# Patient Record
Sex: Male | Born: 1965 | Race: Black or African American | Hispanic: No | Smoking: Former smoker
Health system: Southern US, Community
[De-identification: ages and names within clinical notes are randomized; demographics above are authoritative.]

---

## 1990-04-01 DIAGNOSIS — Z9889 Other specified postprocedural states: Secondary | ICD-10-CM

## 1990-04-01 HISTORY — DX: Other specified postprocedural states: Z98.890

## 1999-01-09 ENCOUNTER — Encounter: Payer: Self-pay | Admitting: Emergency Medicine

## 1999-01-09 ENCOUNTER — Emergency Department (HOSPITAL_COMMUNITY): Admission: EM | Admit: 1999-01-09 | Discharge: 1999-01-09 | Payer: Self-pay | Admitting: Emergency Medicine

## 2005-10-28 ENCOUNTER — Emergency Department (HOSPITAL_COMMUNITY): Admission: EM | Admit: 2005-10-28 | Discharge: 2005-10-28 | Payer: Self-pay | Admitting: *Deleted

## 2005-12-16 ENCOUNTER — Emergency Department (HOSPITAL_COMMUNITY): Admission: EM | Admit: 2005-12-16 | Discharge: 2005-12-16 | Payer: Self-pay | Admitting: Emergency Medicine

## 2008-06-11 ENCOUNTER — Emergency Department (HOSPITAL_COMMUNITY): Admission: EM | Admit: 2008-06-11 | Discharge: 2008-06-12 | Payer: Self-pay | Admitting: Emergency Medicine

## 2009-11-16 IMAGING — CR DG CHEST 2V
2 series · 2 of 2 positions shown · non-contrast
Comparison: 12/16/2005.

CLINICAL DATA: Chest pain.

CHEST - 2 VIEW

[w chest pa]
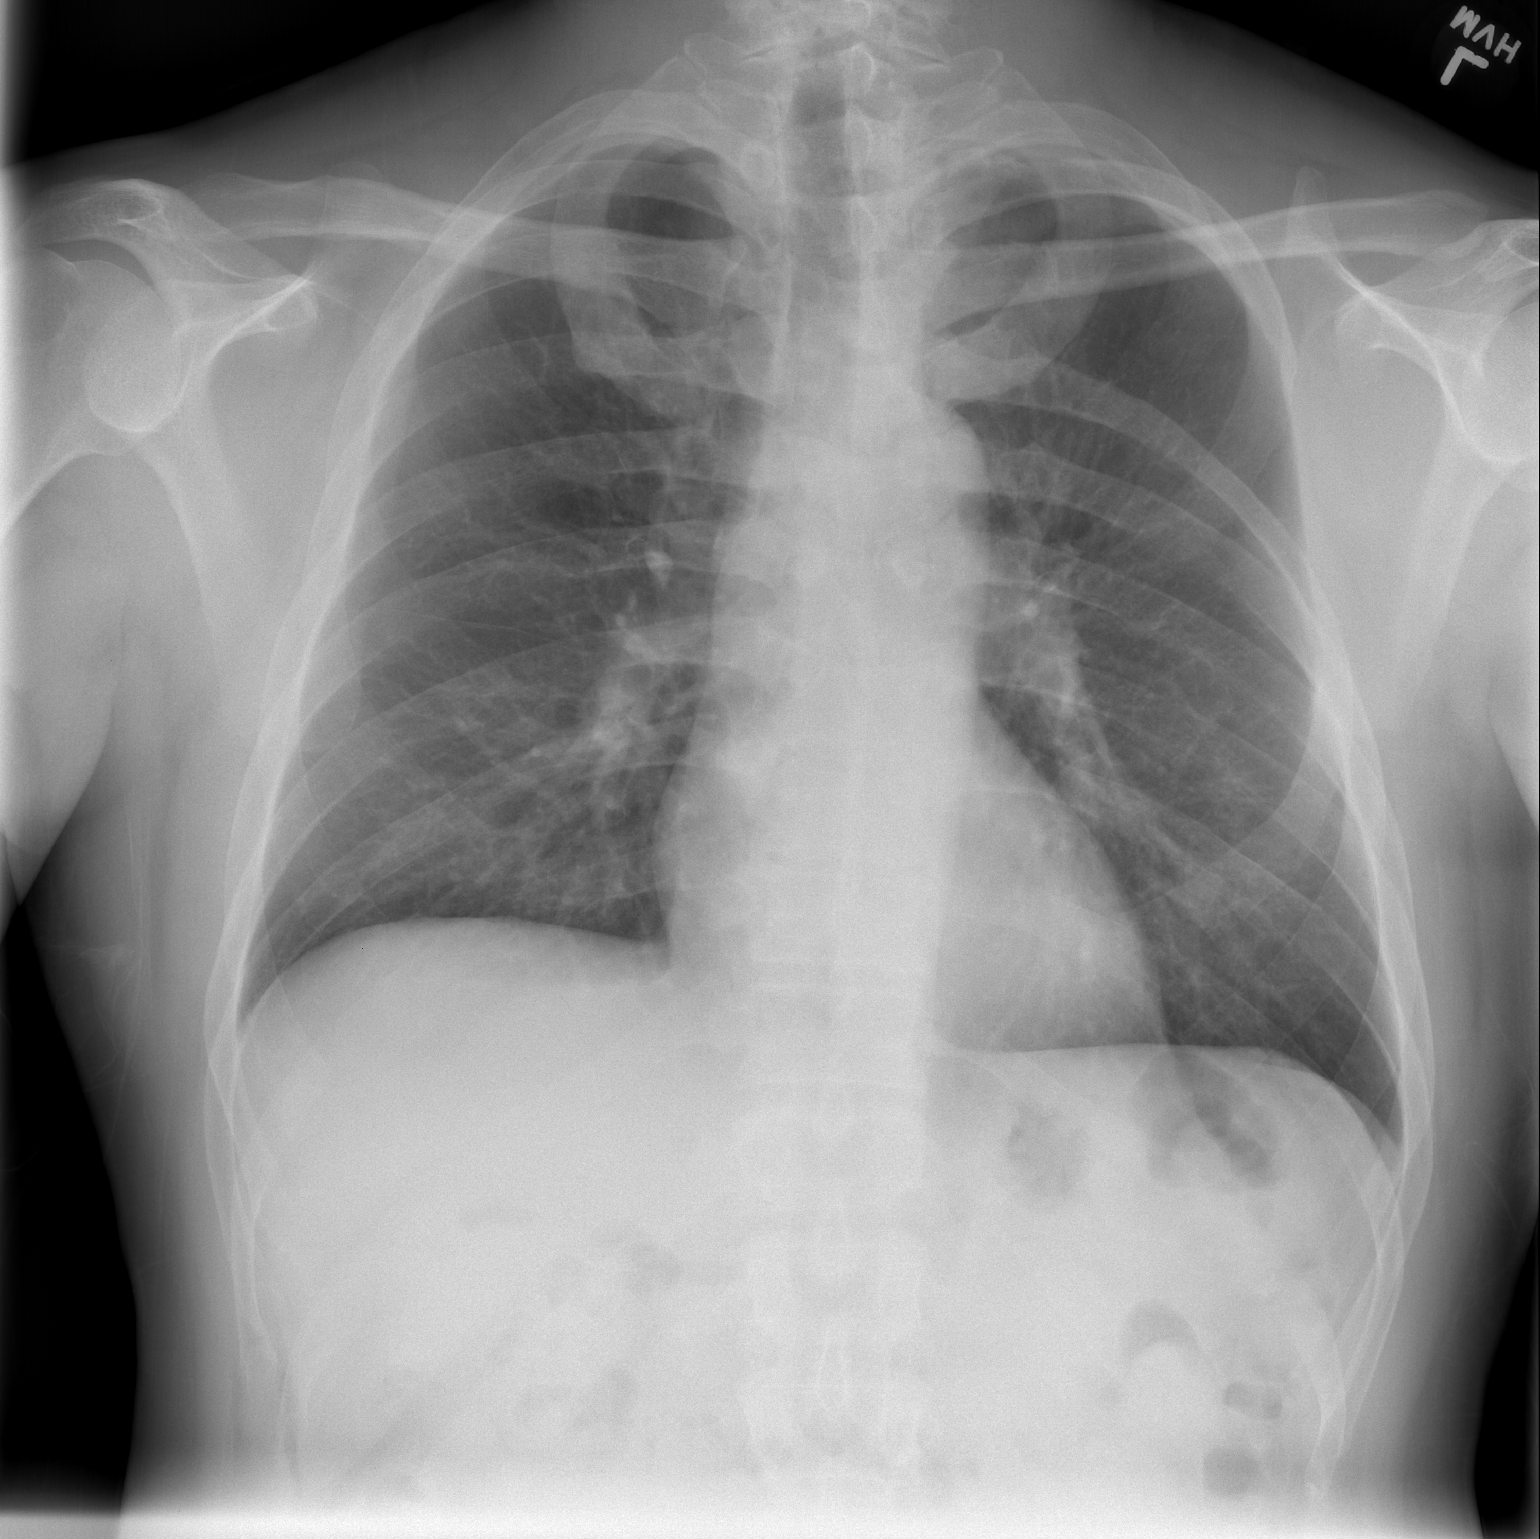

[w chest lat]
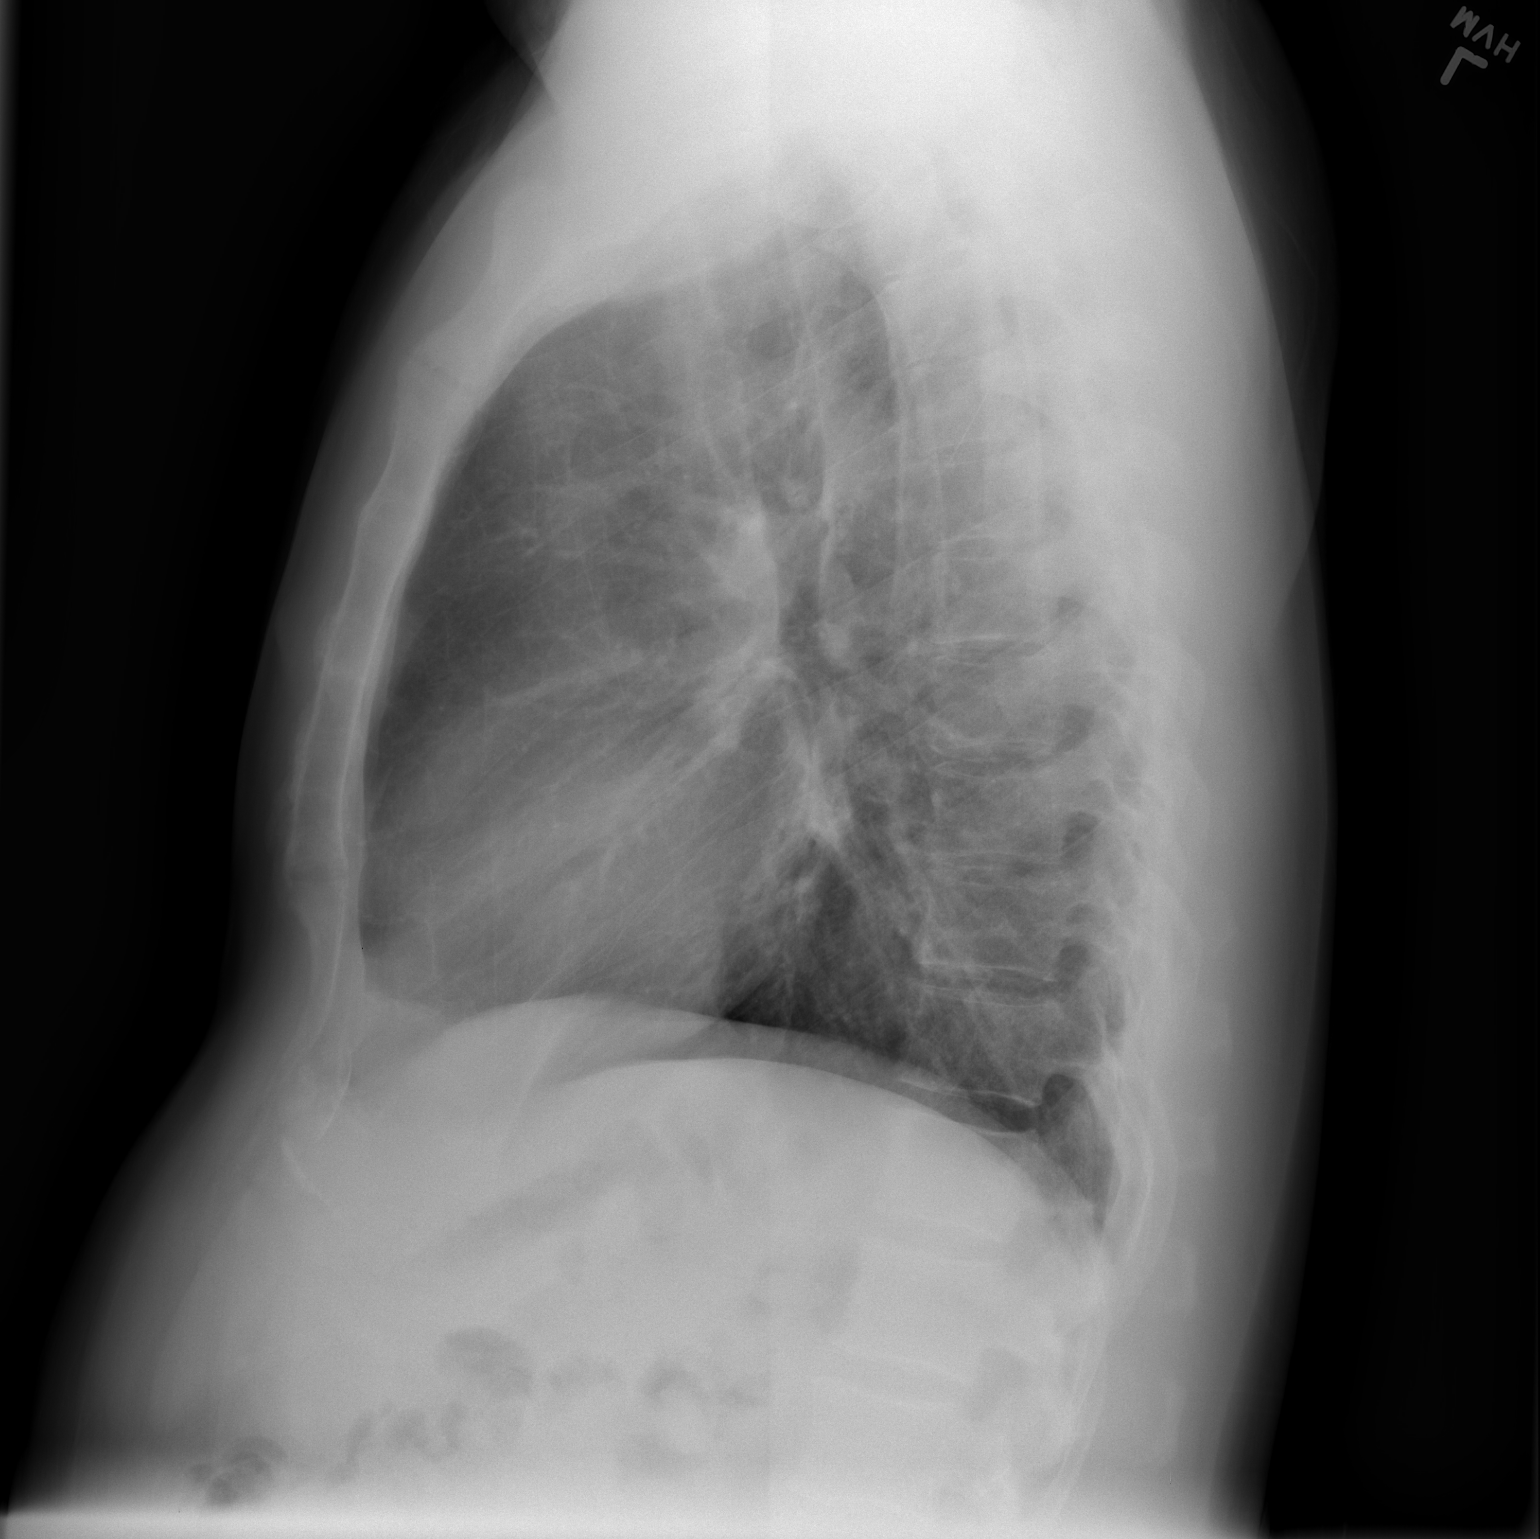

[2 of 2 positions shown; findings below may reference images not displayed]

FINDINGS: Heart size is normal and the vascularity is normal.  The
lungs are clear.  There has been prior resection of the left fourth
rib which is unchanged from the  prior study.
IMPRESSION: No active cardiopulmonary disease.

## 2010-07-12 LAB — CBC
HCT: 43.6 % (ref 39.0–52.0)
Hemoglobin: 14.7 g/dL (ref 13.0–17.0)
MCHC: 33.7 g/dL (ref 30.0–36.0)
MCV: 90.8 fL (ref 78.0–100.0)
Platelets: 331 10*3/uL (ref 150–400)
RBC: 4.8 MIL/uL (ref 4.22–5.81)
RDW: 13.3 % (ref 11.5–15.5)
WBC: 9.7 10*3/uL (ref 4.0–10.5)

## 2010-07-12 LAB — DIFFERENTIAL
Eosinophils Absolute: 0.1 10*3/uL (ref 0.0–0.7)
Eosinophils Relative: 1 % (ref 0–5)
Lymphocytes Relative: 22 % (ref 12–46)
Lymphs Abs: 2.1 10*3/uL (ref 0.7–4.0)
Monocytes Absolute: 1 10*3/uL (ref 0.1–1.0)
Monocytes Relative: 11 % (ref 3–12)

## 2010-07-12 LAB — COMPREHENSIVE METABOLIC PANEL
ALT: 19 U/L (ref 0–53)
AST: 16 U/L (ref 0–37)
Albumin: 4 g/dL (ref 3.5–5.2)
Calcium: 9.6 mg/dL (ref 8.4–10.5)
Creatinine, Ser: 1.02 mg/dL (ref 0.4–1.5)
GFR calc Af Amer: >60 mL/min (ref >60)
Sodium: 139 mEq/L (ref 135–145)

## 2010-07-12 LAB — CK TOTAL AND CKMB (NOT AT ARMC)
Relative Index: 0.6 (ref 0.0–2.5)
Total CK: 110 U/L (ref 7–232)

## 2010-07-12 LAB — TROPONIN I: Troponin I: 0.01 ng/mL (ref 0.00–0.06)

## 2016-08-14 DIAGNOSIS — M6208 Separation of muscle (nontraumatic), other site: Secondary | ICD-10-CM | POA: Insufficient documentation

## 2017-11-18 DIAGNOSIS — K219 Gastro-esophageal reflux disease without esophagitis: Secondary | ICD-10-CM | POA: Insufficient documentation

## 2017-11-18 DIAGNOSIS — M542 Cervicalgia: Secondary | ICD-10-CM | POA: Insufficient documentation

## 2018-04-01 DIAGNOSIS — Z9889 Other specified postprocedural states: Secondary | ICD-10-CM

## 2018-04-01 HISTORY — DX: Other specified postprocedural states: Z98.890

## 2019-04-15 DIAGNOSIS — C182 Malignant neoplasm of ascending colon: Secondary | ICD-10-CM | POA: Insufficient documentation

## 2019-04-28 DIAGNOSIS — C182 Malignant neoplasm of ascending colon: Secondary | ICD-10-CM

## 2019-05-25 DIAGNOSIS — C182 Malignant neoplasm of ascending colon: Secondary | ICD-10-CM

## 2019-06-08 DIAGNOSIS — C182 Malignant neoplasm of ascending colon: Secondary | ICD-10-CM | POA: Diagnosis not present

## 2019-06-22 DIAGNOSIS — C182 Malignant neoplasm of ascending colon: Secondary | ICD-10-CM | POA: Diagnosis not present

## 2019-07-06 DIAGNOSIS — C182 Malignant neoplasm of ascending colon: Secondary | ICD-10-CM

## 2019-07-19 DIAGNOSIS — C182 Malignant neoplasm of ascending colon: Secondary | ICD-10-CM

## 2019-08-03 DIAGNOSIS — C182 Malignant neoplasm of ascending colon: Secondary | ICD-10-CM | POA: Diagnosis not present

## 2019-09-01 DIAGNOSIS — C182 Malignant neoplasm of ascending colon: Secondary | ICD-10-CM | POA: Diagnosis not present

## 2019-09-14 DIAGNOSIS — C182 Malignant neoplasm of ascending colon: Secondary | ICD-10-CM

## 2019-09-28 DIAGNOSIS — C182 Malignant neoplasm of ascending colon: Secondary | ICD-10-CM

## 2019-10-12 DIAGNOSIS — C182 Malignant neoplasm of ascending colon: Secondary | ICD-10-CM

## 2019-11-23 DIAGNOSIS — C182 Malignant neoplasm of ascending colon: Secondary | ICD-10-CM | POA: Diagnosis not present

## 2020-02-11 ENCOUNTER — Telehealth: Payer: Self-pay

## 2020-02-11 NOTE — Telephone Encounter (Signed)
Patient states he and the pain clinic are playing phone tag.  His neuropathy is worse and his body is stiff with pain.  Can we help him?

## 2020-02-29 ENCOUNTER — Other Ambulatory Visit: Payer: Self-pay

## 2020-02-29 ENCOUNTER — Telehealth: Payer: Self-pay

## 2020-02-29 DIAGNOSIS — G620 Drug-induced polyneuropathy: Secondary | ICD-10-CM

## 2020-02-29 DIAGNOSIS — T451X5A Adverse effect of antineoplastic and immunosuppressive drugs, initial encounter: Secondary | ICD-10-CM

## 2020-02-29 DIAGNOSIS — C182 Malignant neoplasm of ascending colon: Secondary | ICD-10-CM

## 2020-02-29 MED ORDER — OXYCODONE HCL 10 MG PO TABS: 10.0000 mg | ORAL_TABLET | ORAL | 0 refills | Status: DC | PRN

## 2020-02-29 NOTE — Telephone Encounter (Signed)
@  1100- I notified Dr Bobby Rumpf of pt's call. He agreed to send in 40 tabs po Oxycodone 10mg  po q 4hr PRN pain.   Pt called to request "a refill of the last pain medication, I have been playing telephone tag with the pain clinic. I rally wish Dr Bobby Rumpf would give me something to get me through until I see them. I'm hurting real bad. I also need a refill on the neuropathy medicine".  I called [facility], pt has refill on his gabapentin, so they will go ahead & fill it.

## 2020-02-29 NOTE — Telephone Encounter (Signed)
Pt notified that Dr Bobby Rumpf sent in Oxycodone.

## 2020-03-07 ENCOUNTER — Other Ambulatory Visit: Payer: Self-pay | Admitting: Hematology and Oncology

## 2020-03-07 DIAGNOSIS — C182 Malignant neoplasm of ascending colon: Secondary | ICD-10-CM

## 2020-03-20 ENCOUNTER — Other Ambulatory Visit: Payer: Self-pay | Admitting: Oncology

## 2020-03-20 DIAGNOSIS — I10 Essential (primary) hypertension: Secondary | ICD-10-CM

## 2020-03-20 DIAGNOSIS — G620 Drug-induced polyneuropathy: Secondary | ICD-10-CM

## 2020-03-20 MED ORDER — GABAPENTIN 300 MG PO CAPS: 300.0000 mg | ORAL_CAPSULE | Freq: Three times a day (TID) | ORAL | 1 refills | Status: DC

## 2020-03-20 MED ORDER — OXYCODONE HCL 10 MG PO TABS: 10.0000 mg | ORAL_TABLET | ORAL | 0 refills | Status: DC | PRN

## 2020-03-22 NOTE — Progress Notes (Signed)
[facility]  [address] [place],  [place]  [address] [phone]  Clinic Day:  03/23/2020  Referring physician: No ref. provider found   HISTORY OF PRESENT ILLNESS:  The patient is a 54 y.o. male with stage IIIB (T3 N1b M0) colon cancer, status post a right hemicolectomy in January 2021.  He completed all 12 cycles of adjuvant chemotherapy in July 2021. 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  He comes in today for routine follow-up.  Since his last visit, he still has fairly significant neuropathy, which impacts his quality of life.  Although he takes gabapentin to help with this, he claims his oxycodone is particularly necessary to keep his neuropathy under better control.  He denies having any new GI symptoms which concern him for overt signs of disease progression.   PHYSICAL EXAM:  Blood pressure (!) 190/95, pulse (!) 59, temperature (!) 97.4 F (36.3 C), resp. rate 16, height 5\' 11"  (1.803 m), weight 199 lb 1.6 oz (90.3 kg), SpO2 99 %. Wt Readings from Last 3 Encounters:  03/23/20 199 lb 1.6 oz (90.3 kg)   Body mass index is 27.77 kg/m. Performance status (ECOG): 1 - Symptomatic but completely ambulatory Physical Exam Constitutional:      Appearance: Normal appearance. He is not ill-appearing.  HENT:     Mouth/Throat:     Mouth: Mucous membranes are moist.     Pharynx: Oropharynx is clear. No oropharyngeal exudate or posterior oropharyngeal erythema.  Cardiovascular:     Rate and Rhythm: Normal rate and regular rhythm.     Heart sounds: No murmur heard. No friction rub. No gallop.   Pulmonary:     Effort: Pulmonary effort is normal. No respiratory distress.     Breath sounds: Normal breath sounds. No wheezing, rhonchi or rales.  Chest:  Breasts:     Right: No axillary adenopathy or supraclavicular adenopathy.     Left: No axillary  adenopathy or supraclavicular adenopathy.    Abdominal:     General: Bowel sounds are normal. There is no distension.     Palpations: Abdomen is soft. There is no mass.     Tenderness: There is no abdominal tenderness.  Musculoskeletal:        General: No swelling.     Right lower leg: No edema.     Left lower leg: No edema.  Lymphadenopathy:     Cervical: No cervical adenopathy.     Upper Body:     Right upper body: No supraclavicular or axillary adenopathy.     Left upper body: No supraclavicular or axillary adenopathy.     Lower Body: No right inguinal adenopathy. No left inguinal adenopathy.  Skin:    General: Skin is warm.     Coloration: Skin is not jaundiced.     Findings: No lesion or rash.  Neurological:     General: No focal deficit present.     Mental Status: He is alert and oriented to person, place, and time. Mental status is at baseline.     Cranial Nerves: Cranial nerves are intact.  Psychiatric:        Mood and Affect: Mood normal.        Behavior: Behavior normal.        Thought Content: Thought content normal.     LABS:   Lab Results  Component Value Date  CEA1 2.5 03/23/2020   /  CEA  Date Value Ref Range Status  03/23/2020 2.5 0.0 - 4.7 ng/mL Final    Comment:    (NOTE)                             Nonsmokers          <3.9                             Smokers             <5.6 [employer] Electrochemiluminescence Immunoassay (ECLIA) Values obtained with different assay methods or kits cannot be used interchangeably.  Results cannot be interpreted as absolute evidence of the presence or absence of malignant disease. Performed At: [facility] [place], [place] [id number] Rush Farmer MD [id number]    ASSESSMENT & PLAN:   Assessment/Plan:  A 54 y.o. male with stage IIIB (T3 N1b M0) colon cancer, status post a right hemicolectomy in January 2021, with his 12 cycles of adjuvant chemotherapy completed in July  2021.   Based upon his physical exam and normal CEA level today, the patient remains disease-free.  Clinically, he appears to be doing well.  I will see him back in 4 months for repeat clinical assessment.  The patient understands all the plans discussed today and is in agreement with them.     Delayla Hoffmaster Macarthur Critchley, MD

## 2020-03-23 ENCOUNTER — Other Ambulatory Visit: Payer: Self-pay | Admitting: Oncology

## 2020-03-23 ENCOUNTER — Inpatient Hospital Stay: Payer: Medicare HMO | Attending: Oncology

## 2020-03-23 ENCOUNTER — Other Ambulatory Visit: Payer: Self-pay

## 2020-03-23 ENCOUNTER — Inpatient Hospital Stay (INDEPENDENT_AMBULATORY_CARE_PROVIDER_SITE_OTHER): Payer: Medicare HMO | Admitting: Oncology

## 2020-03-23 ENCOUNTER — Encounter: Payer: Self-pay | Admitting: Oncology

## 2020-03-23 ENCOUNTER — Telehealth: Payer: Self-pay | Admitting: Oncology

## 2020-03-23 VITALS — BP 190/95 | HR 59 | Temp 97.4°F | Resp 16 | Ht 71.0 in | Wt 199.1 lb

## 2020-03-23 DIAGNOSIS — C182 Malignant neoplasm of ascending colon: Secondary | ICD-10-CM

## 2020-03-23 DIAGNOSIS — Z9049 Acquired absence of other specified parts of digestive tract: Secondary | ICD-10-CM | POA: Diagnosis not present

## 2020-03-23 DIAGNOSIS — Z9221 Personal history of antineoplastic chemotherapy: Secondary | ICD-10-CM | POA: Diagnosis not present

## 2020-03-23 NOTE — Telephone Encounter (Signed)
Per 12/23 LOS, patient scheduled for April 2022 Appt's.  Gave patient Appt Summary

## 2020-03-24 LAB — CEA: CEA: 2.5 ng/mL (ref 0.0–4.7)

## 2020-03-27 ENCOUNTER — Other Ambulatory Visit: Payer: Self-pay | Admitting: Hematology and Oncology

## 2020-03-28 ENCOUNTER — Telehealth: Payer: Self-pay

## 2020-04-04 ENCOUNTER — Other Ambulatory Visit: Payer: Self-pay | Admitting: Hematology and Oncology

## 2020-04-04 DIAGNOSIS — T451X5A Adverse effect of antineoplastic and immunosuppressive drugs, initial encounter: Secondary | ICD-10-CM

## 2020-04-04 DIAGNOSIS — G620 Drug-induced polyneuropathy: Secondary | ICD-10-CM

## 2020-04-04 MED ORDER — OXYCODONE HCL 10 MG PO TABS: 10.0000 mg | ORAL_TABLET | ORAL | 0 refills | Status: DC | PRN

## 2020-04-26 ENCOUNTER — Other Ambulatory Visit: Payer: Self-pay | Admitting: Hematology and Oncology

## 2020-04-26 DIAGNOSIS — T451X5A Adverse effect of antineoplastic and immunosuppressive drugs, initial encounter: Secondary | ICD-10-CM

## 2020-04-26 DIAGNOSIS — G620 Drug-induced polyneuropathy: Secondary | ICD-10-CM

## 2020-04-26 MED ORDER — OXYCODONE HCL 10 MG PO TABS: 10.0000 mg | ORAL_TABLET | ORAL | 0 refills | Status: DC | PRN

## 2020-05-23 ENCOUNTER — Other Ambulatory Visit: Payer: Self-pay | Admitting: Hematology and Oncology

## 2020-05-23 MED ORDER — OXYCODONE HCL 10 MG PO TABS: 10.0000 mg | ORAL_TABLET | ORAL | 0 refills | Status: DC | PRN

## 2020-06-15 ENCOUNTER — Ambulatory Visit (INDEPENDENT_AMBULATORY_CARE_PROVIDER_SITE_OTHER): Payer: Medicare HMO | Admitting: Podiatry

## 2020-06-15 ENCOUNTER — Other Ambulatory Visit: Payer: Self-pay

## 2020-06-15 DIAGNOSIS — L603 Nail dystrophy: Secondary | ICD-10-CM

## 2020-06-15 DIAGNOSIS — G620 Drug-induced polyneuropathy: Secondary | ICD-10-CM | POA: Diagnosis not present

## 2020-06-15 DIAGNOSIS — T451X5A Adverse effect of antineoplastic and immunosuppressive drugs, initial encounter: Secondary | ICD-10-CM

## 2020-06-15 MED ORDER — OXYCODONE HCL 10 MG PO TABS: 10.0000 mg | ORAL_TABLET | ORAL | 0 refills | Status: DC | PRN

## 2020-06-15 NOTE — Progress Notes (Signed)
  Subjective:  Patient ID: Kyle Johns, male    DOB: [birth year],  MRN: [id number]  Chief Complaint  Patient presents with  . Nail Problem    Routine foot care    55 y.o. male presents with the above complaint. History confirmed with patient. 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  Objective:  Physical Exam: warm, good capillary refill, no trophic changes or ulcerative lesions, normal DP and PT pulses and reduced sensory exam. Nails elongated, mild dystrophy. No images are attached to the encounter. Assessment:   1. Nail dystrophy   2. Peripheral neuropathy due to chemotherapy [facility])    Plan:  Patient was evaluated and treated and all questions answered.  Nail dystrophy -Nails debrided secondary to neuropathy  Procedure: Nail Debridement Type of Debridement: manual, sharp debridement. Instrumentation: Nail nipper, rotary burr. Number of Nails: 10  Return in about 3 months (around 09/15/2020) for At risk foot care.

## 2020-06-28 NOTE — Telephone Encounter (Signed)
ERROR

## 2020-07-03 ENCOUNTER — Other Ambulatory Visit: Payer: Self-pay | Admitting: Oncology

## 2020-07-03 DIAGNOSIS — I10 Essential (primary) hypertension: Secondary | ICD-10-CM

## 2020-07-10 ENCOUNTER — Other Ambulatory Visit: Payer: Self-pay | Admitting: Hematology and Oncology

## 2020-07-12 ENCOUNTER — Other Ambulatory Visit: Payer: Self-pay | Admitting: Hematology and Oncology

## 2020-07-12 DIAGNOSIS — G620 Drug-induced polyneuropathy: Secondary | ICD-10-CM

## 2020-07-14 ENCOUNTER — Telehealth: Payer: Self-pay | Admitting: Hematology and Oncology

## 2020-07-14 ENCOUNTER — Other Ambulatory Visit: Payer: Self-pay | Admitting: Hematology and Oncology

## 2020-07-14 MED ORDER — GABAPENTIN 600 MG PO TABS: 600.0000 mg | ORAL_TABLET | Freq: Three times a day (TID) | ORAL | 2 refills | Status: DC
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
## 2020-07-20 ENCOUNTER — Other Ambulatory Visit: Payer: Self-pay

## 2020-07-20 ENCOUNTER — Inpatient Hospital Stay: Payer: Medicare HMO | Attending: Oncology

## 2020-07-20 ENCOUNTER — Other Ambulatory Visit: Payer: Self-pay | Admitting: Hematology and Oncology

## 2020-07-20 ENCOUNTER — Inpatient Hospital Stay (INDEPENDENT_AMBULATORY_CARE_PROVIDER_SITE_OTHER): Payer: Medicare HMO | Admitting: Oncology

## 2020-07-20 ENCOUNTER — Other Ambulatory Visit: Payer: Self-pay | Admitting: Oncology

## 2020-07-20 VITALS — BP 206/113 | HR 60 | Temp 98.4°F | Resp 16 | Ht 71.0 in | Wt 223.8 lb

## 2020-07-20 DIAGNOSIS — Z79899 Other long term (current) drug therapy: Secondary | ICD-10-CM | POA: Diagnosis not present

## 2020-07-20 DIAGNOSIS — T451X5A Adverse effect of antineoplastic and immunosuppressive drugs, initial encounter: Secondary | ICD-10-CM

## 2020-07-20 DIAGNOSIS — G620 Drug-induced polyneuropathy: Secondary | ICD-10-CM | POA: Diagnosis not present

## 2020-07-20 DIAGNOSIS — C182 Malignant neoplasm of ascending colon: Secondary | ICD-10-CM

## 2020-07-20 DIAGNOSIS — Z9049 Acquired absence of other specified parts of digestive tract: Secondary | ICD-10-CM | POA: Insufficient documentation

## 2020-07-20 MED ORDER — OXYCODONE HCL 10 MG PO TABS: 10.0000 mg | ORAL_TABLET | Freq: Four times a day (QID) | ORAL | 0 refills | Status: DC | PRN

## 2020-07-20 MED ORDER — GABAPENTIN 600 MG PO TABS: 600.0000 mg | ORAL_TABLET | Freq: Three times a day (TID) | ORAL | 1 refills | Status: DC

## 2020-07-20 MED ORDER — SILDENAFIL CITRATE 50 MG PO TABS: 50.0000 mg | ORAL_TABLET | Freq: Every day | ORAL | 1 refills | Status: DC | PRN

## 2020-07-21 LAB — CEA: CEA: 2.2 ng/mL (ref 0.0–4.7)

## 2020-07-24 ENCOUNTER — Telehealth: Payer: Self-pay

## 2020-07-24 NOTE — Telephone Encounter (Signed)
Pt called to get his lab results. Pt notified that his CEA is 2.2, normal, and Dr Bobby Rumpf will see him in 4 months. (CEA down from 2.5 @ last draw).

## 2020-08-08 ENCOUNTER — Other Ambulatory Visit: Payer: Self-pay | Admitting: Hematology and Oncology

## 2020-08-08 DIAGNOSIS — G620 Drug-induced polyneuropathy: Secondary | ICD-10-CM

## 2020-08-08 MED ORDER — OXYCODONE HCL 10 MG PO TABS: 10.0000 mg | ORAL_TABLET | ORAL | 0 refills | Status: DC | PRN

## 2020-08-21 ENCOUNTER — Other Ambulatory Visit: Payer: Self-pay | Admitting: Hematology and Oncology

## 2020-08-21 DIAGNOSIS — T451X5A Adverse effect of antineoplastic and immunosuppressive drugs, initial encounter: Secondary | ICD-10-CM

## 2020-08-21 DIAGNOSIS — G620 Drug-induced polyneuropathy: Secondary | ICD-10-CM

## 2020-08-21 MED ORDER — OXYCODONE HCL 10 MG PO TABS: 10.0000 mg | ORAL_TABLET | ORAL | 0 refills | Status: DC | PRN

## 2020-08-29 ENCOUNTER — Other Ambulatory Visit: Payer: Self-pay | Admitting: Hematology and Oncology

## 2020-08-29 DIAGNOSIS — G620 Drug-induced polyneuropathy: Secondary | ICD-10-CM

## 2020-08-29 DIAGNOSIS — T451X5A Adverse effect of antineoplastic and immunosuppressive drugs, initial encounter: Secondary | ICD-10-CM

## 2020-08-29 MED ORDER — OXYCODONE HCL 10 MG PO TABS: 10.0000 mg | ORAL_TABLET | ORAL | 0 refills | Status: DC | PRN

## 2020-09-07 ENCOUNTER — Other Ambulatory Visit: Payer: Self-pay | Admitting: Hematology and Oncology

## 2020-09-07 DIAGNOSIS — T451X5A Adverse effect of antineoplastic and immunosuppressive drugs, initial encounter: Secondary | ICD-10-CM

## 2020-09-07 MED ORDER — OXYCODONE HCL 10 MG PO TABS: 10.0000 mg | ORAL_TABLET | ORAL | 0 refills | Status: DC | PRN

## 2020-09-15 ENCOUNTER — Other Ambulatory Visit: Payer: Self-pay | Admitting: Hematology and Oncology

## 2020-09-15 DIAGNOSIS — G620 Drug-induced polyneuropathy: Secondary | ICD-10-CM

## 2020-09-15 MED ORDER — OXYCODONE HCL 10 MG PO TABS: 10.0000 mg | ORAL_TABLET | ORAL | 0 refills | Status: DC | PRN

## 2020-09-18 ENCOUNTER — Ambulatory Visit (INDEPENDENT_AMBULATORY_CARE_PROVIDER_SITE_OTHER): Payer: Medicare HMO | Admitting: Podiatry

## 2020-09-18 DIAGNOSIS — Z5329 Procedure and treatment not carried out because of patient's decision for other reasons: Secondary | ICD-10-CM

## 2020-09-18 NOTE — Progress Notes (Signed)
No show for appt. 

## 2020-10-11 ENCOUNTER — Other Ambulatory Visit: Payer: Self-pay | Admitting: Hematology and Oncology

## 2020-10-11 DIAGNOSIS — G620 Drug-induced polyneuropathy: Secondary | ICD-10-CM

## 2020-10-11 MED ORDER — OXYCODONE HCL 10 MG PO TABS: 10.0000 mg | ORAL_TABLET | ORAL | 0 refills | Status: DC | PRN

## 2020-11-10 ENCOUNTER — Other Ambulatory Visit: Payer: Self-pay | Admitting: Hematology and Oncology

## 2020-11-10 DIAGNOSIS — G620 Drug-induced polyneuropathy: Secondary | ICD-10-CM

## 2020-11-10 MED ORDER — OXYCODONE HCL 10 MG PO TABS: 10.0000 mg | ORAL_TABLET | ORAL | 0 refills | Status: DC | PRN

## 2020-11-17 ENCOUNTER — Other Ambulatory Visit: Payer: Self-pay | Admitting: Hematology and Oncology

## 2020-11-17 ENCOUNTER — Inpatient Hospital Stay: Payer: Medicare HMO

## 2020-11-17 DIAGNOSIS — C182 Malignant neoplasm of ascending colon: Secondary | ICD-10-CM

## 2020-11-20 ENCOUNTER — Inpatient Hospital Stay: Payer: Medicare HMO | Admitting: Oncology
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
## 2020-11-24 ENCOUNTER — Encounter: Payer: Self-pay | Admitting: Hematology and Oncology

## 2020-11-24 ENCOUNTER — Inpatient Hospital Stay: Payer: Medicare HMO | Attending: Oncology

## 2020-11-24 DIAGNOSIS — Z9049 Acquired absence of other specified parts of digestive tract: Secondary | ICD-10-CM | POA: Diagnosis not present

## 2020-11-24 DIAGNOSIS — G629 Polyneuropathy, unspecified: Secondary | ICD-10-CM | POA: Insufficient documentation

## 2020-11-24 DIAGNOSIS — Z79899 Other long term (current) drug therapy: Secondary | ICD-10-CM | POA: Diagnosis not present

## 2020-11-24 DIAGNOSIS — M170 Bilateral primary osteoarthritis of knee: Secondary | ICD-10-CM | POA: Diagnosis not present

## 2020-11-24 DIAGNOSIS — Z9221 Personal history of antineoplastic chemotherapy: Secondary | ICD-10-CM | POA: Diagnosis not present

## 2020-11-24 DIAGNOSIS — Z85038 Personal history of other malignant neoplasm of large intestine: Secondary | ICD-10-CM | POA: Insufficient documentation

## 2020-11-24 DIAGNOSIS — C182 Malignant neoplasm of ascending colon: Secondary | ICD-10-CM

## 2020-11-24 LAB — CBC: RBC: 4.84 (ref 3.87–5.11)

## 2020-11-24 LAB — BASIC METABOLIC PANEL
BUN: 12 (ref 4–21)
CO2: 27 — AB (ref 13–22)
Chloride: 102 (ref 99–108)
Creatinine: 1.1 (ref 0.6–1.3)
Glucose: 137
Potassium: 3.7 (ref 3.4–5.3)
Sodium: 141 (ref 137–147)

## 2020-11-24 LAB — HEPATIC FUNCTION PANEL
ALT: 25 (ref 10–40)
AST: 34 (ref 14–40)
Alkaline Phosphatase: 147 — AB (ref 25–125)
Bilirubin, Total: 0.5

## 2020-11-24 LAB — CBC AND DIFFERENTIAL
HCT: 43 (ref 41–53)
Hemoglobin: 14.2 (ref 13.5–17.5)
Neutrophils Absolute: 7.89
Platelets: 329 (ref 150–399)
WBC: 11.6

## 2020-11-24 LAB — COMPREHENSIVE METABOLIC PANEL
Albumin: 4.9 (ref 3.5–5.0)
Calcium: 10.3 (ref 8.7–10.7)

## 2020-11-25 LAB — CEA: CEA: 2.2 ng/mL (ref 0.0–4.7)

## 2020-11-28 ENCOUNTER — Other Ambulatory Visit: Payer: Self-pay | Admitting: Oncology

## 2020-11-28 ENCOUNTER — Inpatient Hospital Stay (INDEPENDENT_AMBULATORY_CARE_PROVIDER_SITE_OTHER): Payer: Medicare HMO | Admitting: Oncology

## 2020-11-28 VITALS — BP 168/89 | HR 63 | Temp 98.6°F | Resp 16 | Ht 71.0 in | Wt 225.6 lb

## 2020-11-28 DIAGNOSIS — C182 Malignant neoplasm of ascending colon: Secondary | ICD-10-CM

## 2020-11-28 DIAGNOSIS — G620 Drug-induced polyneuropathy: Secondary | ICD-10-CM

## 2020-11-28 MED ORDER — SILDENAFIL CITRATE 50 MG PO TABS: 50.0000 mg | ORAL_TABLET | Freq: Every day | ORAL | 1 refills | Status: DC | PRN

## 2020-11-28 MED ORDER — OXYCODONE HCL 10 MG PO TABS: 10.0000 mg | ORAL_TABLET | Freq: Four times a day (QID) | ORAL | 0 refills | Status: DC | PRN

## 2020-12-13 ENCOUNTER — Other Ambulatory Visit: Payer: Self-pay | Admitting: Hematology and Oncology

## 2020-12-13 DIAGNOSIS — G620 Drug-induced polyneuropathy: Secondary | ICD-10-CM

## 2020-12-13 MED ORDER — OXYCODONE HCL 10 MG PO TABS: 10.0000 mg | ORAL_TABLET | Freq: Four times a day (QID) | ORAL | 0 refills | Status: DC | PRN

## 2021-01-03 ENCOUNTER — Other Ambulatory Visit: Payer: Self-pay | Admitting: Hematology and Oncology

## 2021-01-03 DIAGNOSIS — T451X5A Adverse effect of antineoplastic and immunosuppressive drugs, initial encounter: Secondary | ICD-10-CM

## 2021-01-03 DIAGNOSIS — G620 Drug-induced polyneuropathy: Secondary | ICD-10-CM

## 2021-01-03 MED ORDER — OXYCODONE HCL 10 MG PO TABS: 10.0000 mg | ORAL_TABLET | Freq: Four times a day (QID) | ORAL | 0 refills | Status: DC | PRN

## 2021-02-13 ENCOUNTER — Other Ambulatory Visit: Payer: Self-pay | Admitting: Hematology and Oncology

## 2021-02-13 DIAGNOSIS — T451X5A Adverse effect of antineoplastic and immunosuppressive drugs, initial encounter: Secondary | ICD-10-CM

## 2021-02-13 DIAGNOSIS — G620 Drug-induced polyneuropathy: Secondary | ICD-10-CM

## 2021-02-13 MED ORDER — OXYCODONE HCL 10 MG PO TABS: 10.0000 mg | ORAL_TABLET | Freq: Four times a day (QID) | ORAL | 0 refills | Status: DC | PRN

## 2021-03-29 ENCOUNTER — Other Ambulatory Visit: Payer: Medicare HMO
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
## 2021-03-30 ENCOUNTER — Ambulatory Visit: Payer: Medicare HMO | Admitting: Oncology
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
## 2021-04-05 ENCOUNTER — Other Ambulatory Visit: Payer: Medicare HMO

## 2021-04-05 ENCOUNTER — Ambulatory Visit: Payer: Self-pay | Admitting: Oncology

## 2021-04-05 ENCOUNTER — Other Ambulatory Visit: Payer: Medicaid Other

## 2021-04-05 ENCOUNTER — Ambulatory Visit: Payer: Medicare HMO | Admitting: Oncology

## 2021-04-17 ENCOUNTER — Telehealth: Payer: Self-pay
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
## 2021-04-18 ENCOUNTER — Other Ambulatory Visit: Payer: Self-pay | Admitting: Oncology

## 2021-04-18 ENCOUNTER — Other Ambulatory Visit: Payer: Self-pay | Admitting: Hematology and Oncology

## 2021-04-18 DIAGNOSIS — G620 Drug-induced polyneuropathy: Secondary | ICD-10-CM

## 2021-04-18 MED ORDER — OXYCODONE HCL 10 MG PO TABS
ORAL_TABLET | ORAL | 0 refills | Status: DC
Start: 2021-04-18 — End: 2021-04-26

## 2021-04-26 ENCOUNTER — Other Ambulatory Visit: Payer: Self-pay | Admitting: Oncology

## 2021-04-26 ENCOUNTER — Telehealth: Payer: Self-pay

## 2021-04-26 MED ORDER — OXYCODONE HCL 10 MG PO TABS: ORAL_TABLET | ORAL | 0 refills | Status: DC

## 2021-04-26 NOTE — Telephone Encounter (Addendum)
I have attempted to call the pt several times without success, to make him aware that Dr Bobby Rumpf sent in another 25 pain pills to get him through.   Pt called to report that he is "down to 3 pain pills and my pain clinic appt isn't until next Thursday or Friday. My whole body is still hurting. I'm hoping y'all can help me out".
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
## 2021-05-14 ENCOUNTER — Other Ambulatory Visit: Payer: Self-pay | Admitting: Oncology

## 2021-05-15 ENCOUNTER — Ambulatory Visit: Payer: Self-pay | Admitting: Oncology

## 2021-05-15 ENCOUNTER — Other Ambulatory Visit: Payer: Medicaid Other
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
## 2021-05-21 ENCOUNTER — Ambulatory Visit: Payer: Self-pay | Admitting: Oncology

## 2021-05-21 ENCOUNTER — Other Ambulatory Visit: Payer: Medicaid Other
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
## 2021-05-28 ENCOUNTER — Other Ambulatory Visit: Payer: Self-pay | Admitting: Oncology

## 2021-05-28 ENCOUNTER — Inpatient Hospital Stay: Payer: 59 | Attending: Oncology

## 2021-05-28 ENCOUNTER — Other Ambulatory Visit: Payer: Self-pay

## 2021-05-28 ENCOUNTER — Telehealth: Payer: Self-pay | Admitting: Oncology

## 2021-05-28 ENCOUNTER — Other Ambulatory Visit: Payer: Self-pay | Admitting: Hematology and Oncology

## 2021-05-28 ENCOUNTER — Inpatient Hospital Stay (INDEPENDENT_AMBULATORY_CARE_PROVIDER_SITE_OTHER): Payer: 59 | Admitting: Oncology

## 2021-05-28 VITALS — BP 166/84 | HR 54 | Temp 98.8°F | Resp 16 | Ht 71.0 in | Wt 220.8 lb

## 2021-05-28 DIAGNOSIS — Z85038 Personal history of other malignant neoplasm of large intestine: Secondary | ICD-10-CM | POA: Diagnosis present

## 2021-05-28 DIAGNOSIS — G620 Drug-induced polyneuropathy: Secondary | ICD-10-CM

## 2021-05-28 DIAGNOSIS — C182 Malignant neoplasm of ascending colon: Secondary | ICD-10-CM

## 2021-05-28 MED ORDER — OXYCODONE HCL 10 MG PO TABS: 10.0000 mg | ORAL_TABLET | Freq: Four times a day (QID) | ORAL | 0 refills | Status: DC | PRN

## 2021-05-28 NOTE — Telephone Encounter (Signed)
Patient has been scheduled for follow-up visit per 05/28/21 los. Pt given an appt calendar with date and time.

## 2021-05-29 LAB — CEA: CEA: 2.3 ng/mL (ref 0.0–4.7)

## 2021-06-09 ENCOUNTER — Other Ambulatory Visit: Payer: Self-pay | Admitting: Hematology and Oncology

## 2021-06-09 DIAGNOSIS — G620 Drug-induced polyneuropathy: Secondary | ICD-10-CM

## 2021-06-09 DIAGNOSIS — T451X5A Adverse effect of antineoplastic and immunosuppressive drugs, initial encounter: Secondary | ICD-10-CM

## 2021-06-26 ENCOUNTER — Other Ambulatory Visit: Payer: Self-pay

## 2021-06-26 DIAGNOSIS — T451X5A Adverse effect of antineoplastic and immunosuppressive drugs, initial encounter: Secondary | ICD-10-CM

## 2021-06-27 ENCOUNTER — Telehealth: Payer: Self-pay

## 2021-06-27 NOTE — Telephone Encounter (Addendum)
06/28/21 - [name],Kyle Johns, states Kyle Johns,Kyle Johns, and Kyle Johns are attempting to reach pt regarding this.  ?Refused Prescriptions ? ? Rx Response ?(Newest Message First) ?Kyle Scrape Johns, Kyle Johns routed conversation to You 42 minutes ago (12:14 PM)  ? ?Kyle Scrape Johns, Kyle Johns  You 43 minutes ago (12:13 PM)  ? ?MP ?He has cancelled pain clinic appointments twice. We will not be refilling pain medicines.    ? ?Kyle Scrape Johns, Kyle Johns  You 2 hours ago (10:20 AM)  ? ?MP ?We are contacting the pain clinic.    ? ?Kyle Scrape Johns, Kyle Johns  You 4 hours ago (8:11 AM)  ? ?MP ?I need to see what Kyle. Bobby Johns wants to do. I'll check with him this morning.    ? ?You  Kyle Ped, Kyle Johns Yesterday (12:52 PM)  ? ?Pt states he has contacted pain clinic, but they haven't called him back. I don't know if you want to decrease qty or not.   ?  ? ?

## 2021-07-02 ENCOUNTER — Other Ambulatory Visit: Payer: Self-pay | Admitting: Hematology and Oncology

## 2021-07-02 DIAGNOSIS — G620 Drug-induced polyneuropathy: Secondary | ICD-10-CM

## 2021-07-02 MED ORDER — OXYCODONE HCL 10 MG PO TABS: 10.0000 mg | ORAL_TABLET | Freq: Four times a day (QID) | ORAL | 0 refills | Status: DC | PRN

## 2021-07-26 ENCOUNTER — Telehealth: Payer: Self-pay

## 2021-07-26 NOTE — Telephone Encounter (Signed)
Sharyn Lull lowe with [facility] called stating patient wanted her to call us in ref to refilling pain meds.  I advised of pain mgmt history with Kyle Johns and the last time we filled his pain meds, we told him we would not be filling again.   ?

## 2021-08-08 NOTE — Progress Notes (Signed)
Enrolled patient into [employer] 

## 2021-08-21 ENCOUNTER — Other Ambulatory Visit: Payer: Self-pay | Admitting: Hematology and Oncology

## 2021-08-29 ENCOUNTER — Other Ambulatory Visit: Payer: Self-pay | Admitting: Hematology and Oncology

## 2021-08-29 DIAGNOSIS — G620 Drug-induced polyneuropathy: Secondary | ICD-10-CM

## 2021-08-29 DIAGNOSIS — T451X5A Adverse effect of antineoplastic and immunosuppressive drugs, initial encounter: Secondary | ICD-10-CM

## 2021-09-03 DIAGNOSIS — G894 Chronic pain syndrome: Secondary | ICD-10-CM | POA: Insufficient documentation

## 2021-09-03 DIAGNOSIS — I10 Essential (primary) hypertension: Secondary | ICD-10-CM | POA: Insufficient documentation

## 2021-09-03 DIAGNOSIS — Z87820 Personal history of traumatic brain injury: Secondary | ICD-10-CM | POA: Insufficient documentation

## 2021-10-18 ENCOUNTER — Telehealth: Payer: Self-pay

## 2021-10-18 NOTE — Telephone Encounter (Signed)
Mr. Cloke called and LM stating he is still having pain from neuropathy.  We have lied to him saying that this will get better after tx and it isn't.  He cannot get off couch to go to pain mgmt appt.  Cannot spend time with family due to pain.  We are treating him like a dog and we do not care.  I spoke with Dr. Bobby Rumpf and he as agreed to write for Tramadol one time.  Patient needs to make appt with pain mgmt.  I called patient and advised.  He does not want tramadol as it does nothing for him.  I asked what he wanted me to do.  He says that if we can't give him what he wants or what he was on, then he wants nothing.  He just wants [place] to kill him so he can go be with his father if he has to live the rest of his life like this.

## 2021-10-22 ENCOUNTER — Telehealth: Payer: Self-pay

## 2021-10-22 NOTE — Telephone Encounter (Signed)
Pt sister (did not leave name) left me an urgent message around 11:14 am to return call in ref to his pain.  I spoke with Dr. Bobby Rumpf and he advised to have upper mgmt return call.  Andreas Newport, asst adm called patient and was hung up on.  She was able to speak to sister who was very understanding and advised of history of multiple referrals to pain mgmt, Lewis agreement to prescribe Tramadol and that was not what the patient wanted.  End of call was again a hang up.

## 2021-10-30 ENCOUNTER — Telehealth: Payer: Self-pay

## 2021-10-30 NOTE — Telephone Encounter (Signed)
@   1004 -Pt left a long detailed message on the nurse line stating, "The last 2 days have been bad. The BC's I'm taking are hurting my stomach. I'm seeing a new doctor with first appointment of 12/20/2021. I need help with pain. I don't want to hear nothing about a pain clinic. My sister just left the emergency room, and said the [facility] is everywhere. I'm not going to the emergency room".

## 2021-11-23 ENCOUNTER — Inpatient Hospital Stay: Payer: Medicare Other | Attending: Oncology

## 2021-11-23 DIAGNOSIS — G629 Polyneuropathy, unspecified: Secondary | ICD-10-CM | POA: Insufficient documentation

## 2021-11-23 DIAGNOSIS — Z9049 Acquired absence of other specified parts of digestive tract: Secondary | ICD-10-CM | POA: Insufficient documentation

## 2021-11-23 DIAGNOSIS — C182 Malignant neoplasm of ascending colon: Secondary | ICD-10-CM

## 2021-11-23 DIAGNOSIS — Z9221 Personal history of antineoplastic chemotherapy: Secondary | ICD-10-CM | POA: Insufficient documentation

## 2021-11-23 DIAGNOSIS — Z79891 Long term (current) use of opiate analgesic: Secondary | ICD-10-CM | POA: Insufficient documentation

## 2021-11-23 DIAGNOSIS — Z85038 Personal history of other malignant neoplasm of large intestine: Secondary | ICD-10-CM | POA: Diagnosis present

## 2021-11-24 LAB — CEA: CEA: 2.5 ng/mL (ref 0.0–4.7)
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
## 2021-11-26 ENCOUNTER — Other Ambulatory Visit: Payer: Self-pay | Admitting: Oncology

## 2021-11-26 ENCOUNTER — Inpatient Hospital Stay (INDEPENDENT_AMBULATORY_CARE_PROVIDER_SITE_OTHER): Payer: Medicare Other | Admitting: Oncology

## 2021-11-26 VITALS — BP 154/74 | HR 55 | Temp 98.5°F | Resp 16 | Ht 71.0 in | Wt 211.0 lb

## 2021-11-26 DIAGNOSIS — C182 Malignant neoplasm of ascending colon: Secondary | ICD-10-CM | POA: Diagnosis not present

## 2021-11-26 MED ORDER — OXYCODONE HCL 10 MG PO TABS: ORAL_TABLET | ORAL | 0 refills | Status: DC

## 2021-11-29 ENCOUNTER — Telehealth: Payer: Self-pay | Admitting: Oncology

## 2021-11-29 NOTE — Telephone Encounter (Signed)
Unable to reach pt via phone or leave vm. Appt calendar mailed to pt for his follow up.

## 2021-12-18 ENCOUNTER — Other Ambulatory Visit: Payer: Self-pay | Admitting: Oncology

## 2021-12-20 ENCOUNTER — Other Ambulatory Visit: Payer: Self-pay

## 2021-12-20 DIAGNOSIS — T451X5A Adverse effect of antineoplastic and immunosuppressive drugs, initial encounter: Secondary | ICD-10-CM

## 2021-12-21 ENCOUNTER — Telehealth: Payer: Self-pay

## 2021-12-21 NOTE — Telephone Encounter (Signed)
9/22- Pt returned call this morning req pain med so he can attend his [place]. I told pt that Dr Bobby Rumpf said he would absolutely not fill pain medication. Pt verbally upset, states he doesn't think he will come back up here for treatment anymore, and then hung up the phone.  9/21 - Pt called requesting pain medication refill. He has been told multiple times that Dr Bobby Rumpf refuses to refill the pain medication anymore. He needs to see his PCP or pain management clinic.

## 2021-12-25 ENCOUNTER — Telehealth: Payer: Self-pay

## 2021-12-25 NOTE — Telephone Encounter (Signed)
I spoke with Kyle Johns. He states, "I am taking BCs for pain because that is all I have. So Dr Bobby Rumpf said he is absolutely not going to refill my pain medication". I replied, "that is correct". "Y'all haven't even set me up with a referral anywhere. Tammy said she did, but I guess she lied". I replied, "if Tammy told you she sent referral she did". I transferred the call to [facility] for Dr Bobby Rumpf, so she could tell him who she had made the referral too. Tammy,CMA, told me she had sent a recent referral to a neurology office, in addition to pain management clinics in the past.

## 2021-12-25 NOTE — Telephone Encounter (Signed)
Pt LVM on nurse line stating, "I don't know who is getting this, Danya Spearman or Tammy. I got an appointment with [facility] for Jan 12, 2022. I'm taking BC's for pain because that is all I have. If my stomach blows up, you know I'm going to get me an attorney. If everyone else can get help with their pain, I can get help with mine. Look, I'm not gonna lay around here and hurt. I see I'm being jerked. Don't jerk me. Help me dammit. Can someone help me until I go to the appointment? I'm trying to be cool and polite. I won't call you guys anymore and I shouldn't have to if I can get some help until doctor appointment. Y'all have a blessed day".

## 2021-12-26 ENCOUNTER — Telehealth: Payer: Self-pay

## 2021-12-26 NOTE — Telephone Encounter (Signed)
Pt called and states, "What am I supposed to do with this neuropathy? I've taken gabapentin for 3 years, and its not helping. I'm tired of being a test dummy". Pt instructed that Dr Bobby Rumpf is not prescribing any medication. Pt very frustrated and hung upon the phone.

## 2022-01-24 ENCOUNTER — Other Ambulatory Visit: Payer: Self-pay

## 2022-01-24 DIAGNOSIS — N529 Male erectile dysfunction, unspecified: Secondary | ICD-10-CM

## 2022-01-24 DIAGNOSIS — T451X5A Adverse effect of antineoplastic and immunosuppressive drugs, initial encounter: Secondary | ICD-10-CM

## 2022-01-24 MED ORDER — SILDENAFIL CITRATE 50 MG PO TABS: 50.0000 mg | ORAL_TABLET | Freq: Every day | ORAL | 1 refills | Status: DC | PRN

## 2022-01-24 MED ORDER — GABAPENTIN 600 MG PO TABS: 600.0000 mg | ORAL_TABLET | Freq: Three times a day (TID) | ORAL | 1 refills | Status: DC

## 2022-02-28 ENCOUNTER — Ambulatory Visit: Payer: Medicare Other | Admitting: Neurology

## 2022-04-18 ENCOUNTER — Telehealth: Payer: Self-pay

## 2022-04-18 NOTE — Telephone Encounter (Signed)
Pt has called requesting pain medication until he sees the neurologist next week. He had an appt in December, but they cancelled it and R/S to Jan. "I don't think is fair. It's like I'm someone's step child and I can't get help. This pain is kicking my butt. This weather is killing me". I asked pt if he had been seen by PCP for pain. He replied, "No, I see them sometime this month". I then asked if he had been seen in emergency room for uncontrolled pain. He replied, "Yes ma'am, but they didn't help me. They think I just want drugs. They have my chart. They know what is wrong with me. I shouldn't have to tell them over and over. I just left and I don't want to ever go back to [place]".

## 2022-04-22 ENCOUNTER — Telehealth: Payer: Self-pay

## 2022-04-22 NOTE — Telephone Encounter (Signed)
Spoke with [facility] to confirm that they cancelled appt with patient and re-scheduled.  [facility] states that Kyle Johns does not have an appt scheduled at all.

## 2022-04-23 ENCOUNTER — Encounter: Payer: Self-pay | Admitting: Neurology

## 2022-04-23 ENCOUNTER — Ambulatory Visit: Payer: 59 | Admitting: Neurology

## 2022-05-28 ENCOUNTER — Other Ambulatory Visit: Payer: Medicare Other

## 2022-05-29 ENCOUNTER — Ambulatory Visit: Payer: Medicare Other | Admitting: Oncology

## 2022-06-04 ENCOUNTER — Inpatient Hospital Stay: Payer: 59

## 2022-06-05 ENCOUNTER — Ambulatory Visit: Payer: Self-pay | Admitting: Oncology

## 2022-06-11 ENCOUNTER — Other Ambulatory Visit: Payer: 59

## 2022-06-12 ENCOUNTER — Ambulatory Visit: Payer: Self-pay | Admitting: Oncology

## 2022-06-18 ENCOUNTER — Inpatient Hospital Stay: Payer: 59 | Attending: Oncology

## 2022-06-18 ENCOUNTER — Ambulatory Visit: Payer: 59 | Admitting: Neurology

## 2022-06-18 DIAGNOSIS — C182 Malignant neoplasm of ascending colon: Secondary | ICD-10-CM

## 2022-06-18 DIAGNOSIS — Z85038 Personal history of other malignant neoplasm of large intestine: Secondary | ICD-10-CM | POA: Diagnosis not present
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
## 2022-06-19 ENCOUNTER — Other Ambulatory Visit: Payer: Self-pay | Admitting: Oncology

## 2022-06-19 ENCOUNTER — Inpatient Hospital Stay (INDEPENDENT_AMBULATORY_CARE_PROVIDER_SITE_OTHER): Payer: 59 | Admitting: Oncology

## 2022-06-19 VITALS — BP 171/82 | HR 61 | Temp 99.0°F | Resp 16 | Ht 71.0 in | Wt 215.2 lb

## 2022-06-19 DIAGNOSIS — T451X5A Adverse effect of antineoplastic and immunosuppressive drugs, initial encounter: Secondary | ICD-10-CM

## 2022-06-19 DIAGNOSIS — C182 Malignant neoplasm of ascending colon: Secondary | ICD-10-CM

## 2022-06-19 MED ORDER — OXYCODONE HCL 10 MG PO TABS: ORAL_TABLET | ORAL | 0 refills | Status: DC

## 2022-06-20 ENCOUNTER — Telehealth: Payer: Self-pay

## 2022-06-20 LAB — CEA: CEA: 2 ng/mL (ref 0.0–4.7)

## 2022-06-20 NOTE — Telephone Encounter (Signed)
Dr Bobby Rumpf asked me to call pt and let him know that his tumor marker (CEA) is normal, remains cancer free.

## 2022-12-19 ENCOUNTER — Telehealth: Payer: Self-pay | Admitting: Oncology

## 2022-12-19 ENCOUNTER — Inpatient Hospital Stay: Payer: 59
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
## 2022-12-19 NOTE — Telephone Encounter (Signed)
12/19/2022  Patient missed his Appt's this morning - Left Msg for pt to call scheduling to be rescheduled

## 2022-12-20 ENCOUNTER — Inpatient Hospital Stay: Payer: 59 | Admitting: Oncology

## 2022-12-20 ENCOUNTER — Encounter: Payer: Self-pay | Admitting: Oncology

## 2023-01-06 ENCOUNTER — Ambulatory Visit: Payer: Medicare HMO | Admitting: Internal Medicine

## 2023-01-06 ENCOUNTER — Encounter: Payer: Self-pay | Admitting: Internal Medicine

## 2023-01-06 VITALS — BP 164/88 | HR 58 | Temp 97.5°F | Ht 71.5 in | Wt 215.0 lb

## 2023-01-06 DIAGNOSIS — Z87820 Personal history of traumatic brain injury: Secondary | ICD-10-CM

## 2023-01-06 DIAGNOSIS — E785 Hyperlipidemia, unspecified: Secondary | ICD-10-CM

## 2023-01-06 DIAGNOSIS — K219 Gastro-esophageal reflux disease without esophagitis: Secondary | ICD-10-CM

## 2023-01-06 DIAGNOSIS — G894 Chronic pain syndrome: Secondary | ICD-10-CM | POA: Diagnosis not present

## 2023-01-06 DIAGNOSIS — E1169 Type 2 diabetes mellitus with other specified complication: Secondary | ICD-10-CM | POA: Insufficient documentation

## 2023-01-06 DIAGNOSIS — F419 Anxiety disorder, unspecified: Secondary | ICD-10-CM

## 2023-01-06 DIAGNOSIS — Z6829 Body mass index (BMI) 29.0-29.9, adult: Secondary | ICD-10-CM | POA: Diagnosis not present

## 2023-01-06 DIAGNOSIS — M542 Cervicalgia: Secondary | ICD-10-CM

## 2023-01-06 DIAGNOSIS — I10 Essential (primary) hypertension: Secondary | ICD-10-CM

## 2023-01-06 MED ORDER — LISINOPRIL-HYDROCHLOROTHIAZIDE 20-25 MG PO TABS: 1.0000 | ORAL_TABLET | Freq: Every day | ORAL | 5 refills | Status: DC

## 2023-01-06 MED ORDER — MIRTAZAPINE 15 MG PO TABS: 15.0000 mg | ORAL_TABLET | Freq: Every day | ORAL | 2 refills | Status: DC

## 2023-01-06 NOTE — Assessment & Plan Note (Signed)
Uncontrolled so I will increase the dose of lisinopril/hydrochlorothiazide 20/25 mg daily.

## 2023-01-06 NOTE — Progress Notes (Addendum)
Office Visit  Subjective   Patient ID: Kyle Johns   DOB: 1965/04/19   Age: 57 y.o.   MRN: [id number]   Chief Complaint Chief Complaint  Patient presents with   New Patient (Initial Visit)    New patient      History of Present Illness 57 years old male who is here to establish care with [place]. He has history of colon cancer s/o colon resection finished 12 rounds of chemotherapy and he is cancer free and he will follow with them.  He has hypertension, he takes lisinopril/hydrochlorothiazide 10/12.5 mg and metoprolol 25 mg daily. His blood pressure has been high in cancer center also.  He also has chronic pain all over, neck, back and he also has peripheral neuropathy from chemotherapy. He tales gabapentin 600 mh three time per day. He says that it does not help with his pain.   He also has headache that probably migraine and he use to see neurologist. He is getting headache twice a month and it can last 2 days sometime.    He also says that he cannot sleep and just toss and turn.  He feels anxious as well.  Past Medical History Past Medical History:  Diagnosis Date   History of colon surgery 2020   History of lumbar surgery 1992     Allergies No Known Allergies   Review of Systems Review of Systems  Constitutional:  Positive for malaise/fatigue.  HENT: Negative.    Respiratory: Negative.    Cardiovascular: Negative.   Gastrointestinal: Negative.   Musculoskeletal:  Positive for back pain and neck pain.  Neurological: Negative.   Psychiatric/Behavioral:  The patient has insomnia.        Objective:    Vitals BP (!) 164/88 (BP Location: Left Arm, Patient Position: Sitting, Cuff Size: Normal)   Pulse (!) 58   Temp (!) 97.5 F (36.4 C)   Ht 5' 11.5" (1.816 m)   Wt 215 lb (97.5 kg)   SpO2 96%   BMI 29.57 kg/m    Physical Examination Physical Exam Constitutional:      Appearance: Normal appearance.  HENT:     Head: Normocephalic and atraumatic.  Eyes:      Extraocular Movements: Extraocular movements intact.     Pupils: Pupils are equal, round, and reactive to light.  Cardiovascular:     Rate and Rhythm: Normal rate and regular rhythm.     Heart sounds: Normal heart sounds.  Pulmonary:     Effort: Pulmonary effort is normal.     Breath sounds: Normal breath sounds.  Abdominal:     General: Bowel sounds are normal.     Palpations: Abdomen is soft.  Neurological:     General: No focal deficit present.     Mental Status: He is alert and oriented to person, place, and time.        Assessment & Plan:   Benign essential hypertension Uncontrolled so I will increase the dose of lisinopril/hydrochlorothiazide 20/25 mg daily.  Dyslipidemia associated with type 2 diabetes mellitus (HCC) He is not aware that he has diabetes mellitus. He will watch his diet.   Chronic pain syndrome I will refer him to pain clinic  Anxiety I will start him on Remeron 15 mg at nighttime that will help with his insomnia and anxiety.  Neck pain I will refer him to pain clinic.    Return in about 4 weeks (around 02/03/2023).   Eloisa Northern, MD

## 2023-01-06 NOTE — Assessment & Plan Note (Signed)
I will refer him to pain clinic

## 2023-01-06 NOTE — Assessment & Plan Note (Signed)
He is not aware that he has diabetes mellitus. He will watch his diet.

## 2023-01-06 NOTE — Assessment & Plan Note (Signed)
I will start him on Remeron 15 mg at nighttime that will help with his insomnia and anxiety.

## 2023-02-05 ENCOUNTER — Other Ambulatory Visit: Payer: Self-pay | Admitting: Internal Medicine

## 2023-02-05 ENCOUNTER — Encounter: Payer: 59 | Admitting: Internal Medicine

## 2023-02-05 DIAGNOSIS — I10 Essential (primary) hypertension: Secondary | ICD-10-CM

## 2023-02-14 ENCOUNTER — Ambulatory Visit: Payer: Medicare HMO | Admitting: Internal Medicine

## 2023-02-17 ENCOUNTER — Ambulatory Visit: Payer: Medicare HMO | Admitting: Internal Medicine

## 2023-02-17 ENCOUNTER — Encounter: Payer: Self-pay | Admitting: Internal Medicine

## 2023-02-17 VITALS — BP 140/90 | HR 84 | Temp 97.0°F | Resp 18 | Ht 71.5 in | Wt 212.2 lb

## 2023-02-17 DIAGNOSIS — I10 Essential (primary) hypertension: Secondary | ICD-10-CM | POA: Diagnosis not present

## 2023-02-17 DIAGNOSIS — T451X5A Adverse effect of antineoplastic and immunosuppressive drugs, initial encounter: Secondary | ICD-10-CM

## 2023-02-17 DIAGNOSIS — E785 Hyperlipidemia, unspecified: Secondary | ICD-10-CM

## 2023-02-17 DIAGNOSIS — G894 Chronic pain syndrome: Secondary | ICD-10-CM

## 2023-02-17 DIAGNOSIS — E1169 Type 2 diabetes mellitus with other specified complication: Secondary | ICD-10-CM

## 2023-02-17 DIAGNOSIS — G620 Drug-induced polyneuropathy: Secondary | ICD-10-CM

## 2023-02-17 MED ORDER — GABAPENTIN 600 MG PO TABS: 600.0000 mg | ORAL_TABLET | Freq: Three times a day (TID) | ORAL | 2 refills | Status: DC

## 2023-02-17 MED ORDER — OXYCODONE-ACETAMINOPHEN 10-325 MG PO TABS: 1.0000 | ORAL_TABLET | Freq: Four times a day (QID) | ORAL | 0 refills | Status: DC | PRN

## 2023-02-17 NOTE — Progress Notes (Addendum)
   Office Visit  Subjective   Patient ID: Kyle Johns   DOB: [birth year]   Age: 57 y.o.   MRN: [id number]   Chief Complaint Chief Complaint  Patient presents with   Follow up home health orders     History of Present Illness 57 years old male who is here for follow up. He says that his nephew died and then his father died in [place]. He also says that he hurt all over body, he has seen Neurologist in [place] and and has been taking percocet but no body understand as he hurt so much. Now he has to go to [place] to attend his father funeral and that place is cold and with pain he will be miserable. He is willing to go to pain clinic but want something to be given for this trip. I have reviewed notes from Dr. Melvyn Neth office. He also has peripheral neuropathy from chemotherapy. He says that he need some help with back pain and it hurt when he walk and he need to go to funeral for his father in [place]. ales gabapentin 600 mh three time per day. He says that it does not help with his pain.    He also has headache that probably migraine and he use to see neurologist. He is getting headache twice a month and it can last 2 days sometime.     He has hypertension, he takes lisinopril/hydrochlorothiazide 10/12.5 mg and metoprolol 25 mg daily. His blood pressure has been high in cancer center also.   He has history of colon cancer s/o colon resection finished 12 rounds of chemotherapy and he is cancer free and he will follow with them.  Past Medical History Past Medical History:  Diagnosis Date   History of colon surgery 2020   History of lumbar surgery 1992     Allergies No Known Allergies   Review of Systems Review of Systems  Constitutional: Negative.   Respiratory: Negative.    Cardiovascular: Negative.   Gastrointestinal: Negative.   Neurological: Negative.        Objective:    Vitals BP (!) 140/90 (BP Location: Left Arm, Patient Position: Sitting, Cuff Size: Normal)   Pulse  84   Temp (!) 97 F (36.1 C)   Resp 18   Ht 5' 11.5" (1.816 m)   Wt 212 lb 4 oz (96.3 kg)   SpO2 94%   BMI 29.19 kg/m    Physical Examination Physical Exam Constitutional:      Appearance: Normal appearance.  HENT:     Head: Normocephalic and atraumatic.  Cardiovascular:     Rate and Rhythm: Normal rate and regular rhythm.     Heart sounds: Normal heart sounds.  Pulmonary:     Effort: Pulmonary effort is normal.     Breath sounds: Normal breath sounds.  Abdominal:     General: Bowel sounds are normal.     Palpations: Abdomen is soft.  Neurological:     General: No focal deficit present.     Mental Status: He is alert and oriented to person, place, and time.        Assessment & Plan:   Benign essential hypertension controlled  Dyslipidemia associated with type 2 diabetes mellitus (HCC) I have no recent labs with him.     Return in about 1 month (around 03/19/2023).   Eloisa Northern, MD

## 2023-02-17 NOTE — Assessment & Plan Note (Signed)
controlled 

## 2023-02-17 NOTE — Assessment & Plan Note (Signed)
I have no recent labs with him.

## 2023-02-17 NOTE — Assessment & Plan Note (Signed)
He was followed by neurologist in [place] and was given oxycodone 10 mg by Dr. Raynaldo Opitz.

## 2023-03-13 DIAGNOSIS — H52223 Regular astigmatism, bilateral: Secondary | ICD-10-CM | POA: Diagnosis not present

## 2023-03-13 DIAGNOSIS — H25813 Combined forms of age-related cataract, bilateral: Secondary | ICD-10-CM | POA: Diagnosis not present

## 2023-03-13 DIAGNOSIS — H5203 Hypermetropia, bilateral: Secondary | ICD-10-CM | POA: Diagnosis not present

## 2023-03-13 DIAGNOSIS — H524 Presbyopia: Secondary | ICD-10-CM | POA: Diagnosis not present

## 2023-03-19 ENCOUNTER — Ambulatory Visit: Payer: Medicare HMO | Admitting: Internal Medicine

## 2023-04-10 ENCOUNTER — Other Ambulatory Visit: Payer: Self-pay | Admitting: Internal Medicine

## 2023-04-10 MED ORDER — OXYCODONE-ACETAMINOPHEN 10-325 MG PO TABS: 1.0000 | ORAL_TABLET | Freq: Three times a day (TID) | ORAL | 0 refills | Status: DC | PRN

## 2023-04-10 NOTE — Progress Notes (Signed)
 I have spoken with Kyle Johns on phone and he says that he never received a call from pain clinic.  He says that he is hurting so much and he is so stiff he cannot even go to the bathroom.  He is hurting all over.  I have given him 28 tablets in November 18 when he was going to his father's  As he was going to [place] at that time.  I have told him that I will send 1 week prescription for him and he has an appointment in our clinic next week.

## 2023-04-11 ENCOUNTER — Other Ambulatory Visit: Payer: Self-pay

## 2023-04-16 ENCOUNTER — Ambulatory Visit: Payer: Medicare HMO | Admitting: Internal Medicine

## 2023-04-17 ENCOUNTER — Other Ambulatory Visit: Payer: Self-pay

## 2023-04-17 MED ORDER — MIRTAZAPINE 15 MG PO TABS: 15.0000 mg | ORAL_TABLET | Freq: Every day | ORAL | 2 refills | Status: DC

## 2023-05-23 DIAGNOSIS — Z0100 Encounter for examination of eyes and vision without abnormal findings: Secondary | ICD-10-CM | POA: Diagnosis not present

## 2023-06-16 DIAGNOSIS — Z6831 Body mass index (BMI) 31.0-31.9, adult: Secondary | ICD-10-CM | POA: Diagnosis not present

## 2023-06-16 DIAGNOSIS — Z8249 Family history of ischemic heart disease and other diseases of the circulatory system: Secondary | ICD-10-CM | POA: Diagnosis not present

## 2023-06-16 DIAGNOSIS — Z809 Family history of malignant neoplasm, unspecified: Secondary | ICD-10-CM | POA: Diagnosis not present

## 2023-06-16 DIAGNOSIS — G629 Polyneuropathy, unspecified: Secondary | ICD-10-CM | POA: Diagnosis not present

## 2023-06-16 DIAGNOSIS — Z85038 Personal history of other malignant neoplasm of large intestine: Secondary | ICD-10-CM | POA: Diagnosis not present

## 2023-06-16 DIAGNOSIS — F325 Major depressive disorder, single episode, in full remission: Secondary | ICD-10-CM | POA: Diagnosis not present

## 2023-06-16 DIAGNOSIS — I10 Essential (primary) hypertension: Secondary | ICD-10-CM | POA: Diagnosis not present

## 2023-06-16 DIAGNOSIS — Z008 Encounter for other general examination: Secondary | ICD-10-CM | POA: Diagnosis not present

## 2023-06-16 DIAGNOSIS — E669 Obesity, unspecified: Secondary | ICD-10-CM | POA: Diagnosis not present

## 2023-06-16 DIAGNOSIS — Z833 Family history of diabetes mellitus: Secondary | ICD-10-CM | POA: Diagnosis not present

## 2023-06-16 DIAGNOSIS — K219 Gastro-esophageal reflux disease without esophagitis: Secondary | ICD-10-CM | POA: Diagnosis not present

## 2023-06-19 ENCOUNTER — Other Ambulatory Visit: Payer: Self-pay | Admitting: Internal Medicine

## 2023-07-15 ENCOUNTER — Other Ambulatory Visit: Payer: Self-pay | Admitting: Internal Medicine

## 2023-07-15 DIAGNOSIS — I10 Essential (primary) hypertension: Secondary | ICD-10-CM

## 2023-07-29 ENCOUNTER — Other Ambulatory Visit: Payer: Self-pay | Admitting: Internal Medicine

## 2023-07-29 ENCOUNTER — Other Ambulatory Visit: Payer: Self-pay

## 2023-07-29 DIAGNOSIS — I10 Essential (primary) hypertension: Secondary | ICD-10-CM

## 2023-07-29 MED ORDER — METOPROLOL TARTRATE 25 MG PO TABS: 25.0000 mg | ORAL_TABLET | Freq: Two times a day (BID) | ORAL | 0 refills | Status: DC

## 2023-07-29 MED ORDER — LISINOPRIL-HYDROCHLOROTHIAZIDE 20-25 MG PO TABS: 1.0000 | ORAL_TABLET | Freq: Every day | ORAL | 0 refills | Status: DC

## 2023-08-08 ENCOUNTER — Ambulatory Visit: Admitting: Internal Medicine

## 2023-08-11 ENCOUNTER — Other Ambulatory Visit: Payer: Self-pay | Admitting: Internal Medicine

## 2023-08-11 DIAGNOSIS — I10 Essential (primary) hypertension: Secondary | ICD-10-CM

## 2023-08-15 ENCOUNTER — Ambulatory Visit: Admitting: Internal Medicine

## 2023-08-15 ENCOUNTER — Telehealth: Admitting: Internal Medicine

## 2023-08-18 ENCOUNTER — Other Ambulatory Visit: Payer: Self-pay

## 2023-08-18 DIAGNOSIS — I10 Essential (primary) hypertension: Secondary | ICD-10-CM

## 2023-08-18 MED ORDER — METOPROLOL TARTRATE 25 MG PO TABS: 25.0000 mg | ORAL_TABLET | Freq: Two times a day (BID) | ORAL | 0 refills | Status: DC

## 2023-08-22 ENCOUNTER — Encounter: Payer: Self-pay | Admitting: Internal Medicine

## 2023-08-22 ENCOUNTER — Ambulatory Visit: Admitting: Internal Medicine

## 2023-08-22 VITALS — BP 140/88 | HR 54 | Temp 98.0°F | Resp 18 | Ht 71.5 in | Wt 218.0 lb

## 2023-08-22 DIAGNOSIS — G620 Drug-induced polyneuropathy: Secondary | ICD-10-CM | POA: Insufficient documentation

## 2023-08-22 DIAGNOSIS — J069 Acute upper respiratory infection, unspecified: Secondary | ICD-10-CM | POA: Diagnosis not present

## 2023-08-22 DIAGNOSIS — Z131 Encounter for screening for diabetes mellitus: Secondary | ICD-10-CM | POA: Diagnosis not present

## 2023-08-22 DIAGNOSIS — C182 Malignant neoplasm of ascending colon: Secondary | ICD-10-CM | POA: Diagnosis not present

## 2023-08-22 DIAGNOSIS — M5442 Lumbago with sciatica, left side: Secondary | ICD-10-CM

## 2023-08-22 DIAGNOSIS — M5441 Lumbago with sciatica, right side: Secondary | ICD-10-CM | POA: Diagnosis not present

## 2023-08-22 DIAGNOSIS — G8929 Other chronic pain: Secondary | ICD-10-CM | POA: Diagnosis not present

## 2023-08-22 DIAGNOSIS — I10 Essential (primary) hypertension: Secondary | ICD-10-CM

## 2023-08-22 MED ORDER — GUAIFENESIN-CODEINE 100-10 MG/5ML PO SOLN: 10.0000 mL | Freq: Three times a day (TID) | ORAL | 0 refills | Status: DC | PRN

## 2023-08-22 MED ORDER — SALINE SPRAY 0.65 % NA SOLN: 1.0000 | NASAL | 0 refills | Status: DC | PRN

## 2023-08-22 NOTE — Progress Notes (Unsigned)
   Office Visit  Subjective   Patient ID: Kyle Johns   DOB: 1965/07/10   Age: 58 y.o.   MRN: [id number]   Chief Complaint Chief Complaint  Patient presents with   Follow-up    Follow up / paper work      History of Present Illness 58 years old male who is here for follow up. He says that his nephew died and then his father died in [place]. He also says that he hurt all over body, he has seen Neurologist in [place] and and has been taking percocet but no body understand as he hurt so much. Now he has to go to [place] to attend his father funeral and that place is cold and with pain he will be miserable. He is willing to go to pain clinic but want something to be given for this trip. I have reviewed notes from Dr. Harles Lied office. He also has peripheral neuropathy from chemotherapy. He says that he need some help with back pain and it hurt when he walk and he need to go to funeral for his father in [place]. ales gabapentin  600 mh three time per day. He says that it does not help with his pain.    He also has headache that probably migraine and he use to see neurologist. He is getting headache twice a month and it can last 2 days sometime.     He has hypertension, he takes lisinopril /hydrochlorothiazide  10/12.5 mg and metoprolol  25 mg daily. His blood pressure has been high in cancer center also.   He has history of colon cancer s/o colon resection finished 12 rounds of chemotherapy and he is cancer free and he will follow with them.  Past Medical History Past Medical History:  Diagnosis Date   History of colon surgery 2020   History of lumbar surgery 1992     Allergies No Known Allergies   Review of Systems Review of Systems  Constitutional: Negative.   HENT:  Positive for congestion.   Respiratory:  Positive for cough.   Cardiovascular: Negative.   Neurological:  Positive for tingling.       Objective:    Vitals BP (!) 140/88   Pulse (!) 54   Temp 98 F (36.7 C)    Resp 18   Ht 5' 11.5" (1.816 m)   Wt 218 lb (98.9 kg)   SpO2 99%   BMI 29.98 kg/m    Physical Examination Physical Exam Constitutional:      Appearance: Normal appearance.  HENT:     Head: Normocephalic and atraumatic.  Cardiovascular:     Rate and Rhythm: Normal rate and regular rhythm.     Heart sounds: Normal heart sounds.  Pulmonary:     Effort: Pulmonary effort is normal.     Breath sounds: Normal breath sounds.  Neurological:     General: No focal deficit present.     Mental Status: He is alert.        Assessment & Plan:   No problem-specific Assessment & Plan notes found for this encounter.    Return in about 1 week (around 08/29/2023).   Tita Form, MD

## 2023-08-23 LAB — CMP14 + ANION GAP
ALT: 9 IU/L (ref 0–44)
AST: 12 IU/L (ref 0–40)
Albumin: 4 g/dL (ref 3.8–4.9)
Alkaline Phosphatase: 90 IU/L (ref 44–121)
Anion Gap: 16 mmol/L (ref 10.0–18.0)
BUN/Creatinine Ratio: 9 (ref 9–20)
BUN: 8 mg/dL (ref 6–24)
Bilirubin Total: <0.2 mg/dL (ref 0.0–1.2)
CO2: 20 mmol/L (ref 20–29)
Calcium: 9.1 mg/dL (ref 8.7–10.2)
Chloride: 105 mmol/L (ref 96–106)
Creatinine, Ser: 0.93 mg/dL (ref 0.76–1.27)
Globulin, Total: 2.9 g/dL (ref 1.5–4.5)
Glucose: 161 mg/dL — ABNORMAL HIGH (ref 70–99)
Potassium: 4.3 mmol/L (ref 3.5–5.2)
Sodium: 141 mmol/L (ref 134–144)
Total Protein: 6.9 g/dL (ref 6.0–8.5)
eGFR: 96 mL/min/{1.73_m2} (ref >59)

## 2023-08-23 LAB — HEMOGLOBIN A1C
Est. average glucose Bld gHb Est-mCnc: 146 mg/dL
Hgb A1c MFr Bld: 6.7 % — ABNORMAL HIGH (ref 4.8–5.6)

## 2023-08-25 NOTE — Assessment & Plan Note (Signed)
 I will give him cough medication and if is not better I will do chest x-ray.

## 2023-08-25 NOTE — Assessment & Plan Note (Signed)
 He will continue to follow with Cancer Center.

## 2023-08-25 NOTE — Assessment & Plan Note (Signed)
 I will do hemoglobin A1c and renal function today.

## 2023-08-25 NOTE — Assessment & Plan Note (Signed)
His blood pressure is acceptable.

## 2023-08-25 NOTE — Assessment & Plan Note (Signed)
 He has stopped taking gabapentin  as that was not helping.  He wanted something to be given for pain otherwise he is not going to leave our office.  I have discussed with him that I do not have any labs about his renal function I will refer him to pain clinic  For chronic neuropathic pain from chemotherapy.

## 2023-08-26 ENCOUNTER — Other Ambulatory Visit: Payer: Self-pay | Admitting: Internal Medicine

## 2023-08-26 ENCOUNTER — Other Ambulatory Visit: Payer: Self-pay

## 2023-08-26 MED ORDER — METFORMIN HCL ER 500 MG PO TB24: 500.0000 mg | ORAL_TABLET | Freq: Every day | ORAL | 6 refills | Status: DC

## 2023-08-26 MED ORDER — OXYMETAZOLINE HCL 0.05 % NA SOLN: 1.0000 | Freq: Two times a day (BID) | NASAL | 0 refills | Status: AC

## 2023-08-29 ENCOUNTER — Ambulatory Visit: Admitting: Internal Medicine

## 2023-09-03 ENCOUNTER — Ambulatory Visit: Admitting: Internal Medicine

## 2023-09-11 ENCOUNTER — Other Ambulatory Visit: Payer: Self-pay | Admitting: Internal Medicine

## 2023-09-11 DIAGNOSIS — I10 Essential (primary) hypertension: Secondary | ICD-10-CM

## 2023-09-24 ENCOUNTER — Other Ambulatory Visit: Payer: Self-pay

## 2023-09-24 MED ORDER — METFORMIN HCL ER 500 MG PO TB24: 500.0000 mg | ORAL_TABLET | Freq: Every day | ORAL | 6 refills | Status: DC

## 2023-09-24 MED ORDER — GABAPENTIN 600 MG PO TABS: 600.0000 mg | ORAL_TABLET | Freq: Three times a day (TID) | ORAL | 0 refills | Status: DC

## 2023-10-01 ENCOUNTER — Ambulatory Visit: Admitting: Internal Medicine

## 2023-10-01 DIAGNOSIS — Z0000 Encounter for general adult medical examination without abnormal findings: Secondary | ICD-10-CM

## 2023-10-29 ENCOUNTER — Other Ambulatory Visit: Payer: Self-pay | Admitting: Internal Medicine

## 2023-10-29 ENCOUNTER — Telehealth: Payer: Self-pay

## 2023-10-29 DIAGNOSIS — I10 Essential (primary) hypertension: Secondary | ICD-10-CM

## 2023-10-29 NOTE — Telephone Encounter (Signed)
Clarification

## 2023-11-06 ENCOUNTER — Ambulatory Visit: Admitting: Internal Medicine

## 2023-11-06 ENCOUNTER — Encounter: Payer: Self-pay | Admitting: Internal Medicine

## 2023-11-06 VITALS — BP 178/90 | HR 55 | Temp 97.8°F | Resp 18 | Ht 71.0 in | Wt 217.8 lb

## 2023-11-06 DIAGNOSIS — E1169 Type 2 diabetes mellitus with other specified complication: Secondary | ICD-10-CM | POA: Insufficient documentation

## 2023-11-06 DIAGNOSIS — E785 Hyperlipidemia, unspecified: Secondary | ICD-10-CM | POA: Diagnosis not present

## 2023-11-06 DIAGNOSIS — G894 Chronic pain syndrome: Secondary | ICD-10-CM

## 2023-11-06 DIAGNOSIS — I10 Essential (primary) hypertension: Secondary | ICD-10-CM

## 2023-11-06 DIAGNOSIS — G620 Drug-induced polyneuropathy: Secondary | ICD-10-CM

## 2023-11-06 MED ORDER — OXYCODONE-ACETAMINOPHEN 10-325 MG PO TABS
1.0000 | ORAL_TABLET | Freq: Two times a day (BID) | ORAL | 0 refills | Status: DC | PRN
Start: 2023-11-06 — End: 2023-11-07

## 2023-11-06 MED ORDER — DULOXETINE HCL 30 MG PO CPEP: 30.0000 mg | ORAL_CAPSULE | Freq: Every day | ORAL | 3 refills | Status: DC

## 2023-11-06 MED ORDER — PREGABALIN 75 MG PO CAPS
75.0000 mg | ORAL_CAPSULE | Freq: Two times a day (BID) | ORAL | 1 refills | Status: DC
Start: 2023-11-06 — End: 2024-01-02

## 2023-11-06 NOTE — Assessment & Plan Note (Addendum)
 He will sign narcotic dontract.  Will do random drug screen and pill count.

## 2023-11-06 NOTE — Progress Notes (Signed)
 Office Visit  Subjective   Patient ID: Kyle Johns   DOB: May 12, 1965   Age: 58 y.o.   MRN: [id number]   Chief Complaint Chief Complaint  Patient presents with   Follow-up    Follow up     History of Present Illness 58 years old male who is here for follow up. He  Says that pain doctor does not wanted to see him because he missed his appointment.  He says that sometime he forget about his appointment or he has a transport issue that make him difficult to go to the appointment.  He has a chemotherapy-induced neuropathy and he hurts all over.  He was seeing Dr. Oneita neurologist at [facility] and was also getting pain medication from Dr. Ezzard at cancer center.  Currently he is cancer free.  I have referred him multiple times to pain clinic but either he was not accepted or he misses appointment so they did not seem.  He is very upset because he hurt all over and says that he cannot do anything.  He does not wanted to go out to meet people.  He was taking oxycodone  twice a day.  He says that he has tried gabapentin  but that did not help.SABRA    He has hypertension and his blood pressure is uncontrolled.  He has stopped taking lisinopril  -hydrochlorothiazide  because of misunderstanding.  His blood pressure is 178/90.  He is complaining of headache.    He says that he does not drink.  He has diabetes mellitus and he takes metformin .  His hemoglobin A1c was 6.7.    He says that he is still very upset and is anxious but does not have any suicidal ideation.  He says that he is frustrated because of his pain.      His cholesterol was high but I do not have a recent cholesterol result.  I will do lipid panel on next visit.    He has history of colon cancer s/o colon resection finished 12 rounds of chemotherapy and he is cancer free and he will follow with cancer center.  Past Medical History Past Medical History:  Diagnosis Date   History of colon surgery 2020   History of lumbar surgery 1992      Allergies No Known Allergies   Review of Systems Review of Systems  Constitutional: Negative.   Respiratory: Negative.    Cardiovascular: Negative.   Musculoskeletal:  Positive for back pain.       Whole body pain  Neurological: Negative.        Objective:    Vitals BP (!) 178/90   Pulse (!) 55   Temp 97.8 F (36.6 C) (Temporal)   Resp 18   Ht 5' 11 (1.803 m)   Wt 217 lb 12.8 oz (98.8 kg)   SpO2 95%   BMI 30.38 kg/m    Physical Examination Physical Exam Constitutional:      Appearance: Normal appearance.  HENT:     Head: Normocephalic and atraumatic.  Cardiovascular:     Rate and Rhythm: Normal rate and regular rhythm.     Heart sounds: Normal heart sounds.  Pulmonary:     Effort: Pulmonary effort is normal.     Breath sounds: Normal breath sounds.  Abdominal:     General: Bowel sounds are normal.     Palpations: Abdomen is soft.  Neurological:     Mental Status: He is alert. Mental status is at baseline.  Assessment & Plan:   Dyslipidemia associated with type 2 diabetes mellitus (HCC) He takes metformin  and watch his diet  Neuropathy due to drug [facility]) I will start him on percocet 1 tablet PO twice a day and pregabalin  75 mg twice a day. He will sign narcotic contract  Benign essential hypertension He has stopped taking lisinopril -hydrochlorothiazide  20-25 mg daily from today  Chronic pain syndrome He will sign narcotic dontract.  Will do random drug screen and pill count.    Return in about 1 month (around 12/07/2023).   Roetta Dare, MD

## 2023-11-06 NOTE — Assessment & Plan Note (Signed)
 He takes metformin and watch his diet.

## 2023-11-06 NOTE — Assessment & Plan Note (Signed)
 I will start him on percocet 1 tablet PO twice a day and pregabalin  75 mg twice a day. He will sign narcotic contract

## 2023-11-06 NOTE — Assessment & Plan Note (Signed)
 He has stopped taking lisinopril -hydrochlorothiazide  20-25 mg daily from today

## 2023-11-07 ENCOUNTER — Other Ambulatory Visit: Payer: Self-pay | Admitting: Internal Medicine

## 2023-11-07 MED ORDER — OXYCODONE-ACETAMINOPHEN 10-325 MG PO TABS: 1.0000 | ORAL_TABLET | Freq: Two times a day (BID) | ORAL | 0 refills | Status: DC | PRN

## 2023-11-11 ENCOUNTER — Other Ambulatory Visit: Payer: Self-pay | Admitting: Internal Medicine

## 2023-11-11 DIAGNOSIS — I10 Essential (primary) hypertension: Secondary | ICD-10-CM

## 2023-12-05 ENCOUNTER — Ambulatory Visit: Admitting: Internal Medicine

## 2023-12-05 ENCOUNTER — Encounter: Payer: Self-pay | Admitting: Internal Medicine

## 2023-12-05 ENCOUNTER — Other Ambulatory Visit: Payer: Self-pay | Admitting: Internal Medicine

## 2023-12-05 VITALS — BP 140/100 | HR 60 | Temp 97.2°F | Resp 18 | Ht 71.0 in | Wt 213.2 lb

## 2023-12-05 DIAGNOSIS — G894 Chronic pain syndrome: Secondary | ICD-10-CM

## 2023-12-05 DIAGNOSIS — M5442 Lumbago with sciatica, left side: Secondary | ICD-10-CM

## 2023-12-05 DIAGNOSIS — E785 Hyperlipidemia, unspecified: Secondary | ICD-10-CM | POA: Diagnosis not present

## 2023-12-05 DIAGNOSIS — I10 Essential (primary) hypertension: Secondary | ICD-10-CM

## 2023-12-05 DIAGNOSIS — M5441 Lumbago with sciatica, right side: Secondary | ICD-10-CM | POA: Diagnosis not present

## 2023-12-05 DIAGNOSIS — F419 Anxiety disorder, unspecified: Secondary | ICD-10-CM

## 2023-12-05 DIAGNOSIS — E1169 Type 2 diabetes mellitus with other specified complication: Secondary | ICD-10-CM | POA: Diagnosis not present

## 2023-12-05 DIAGNOSIS — G8929 Other chronic pain: Secondary | ICD-10-CM

## 2023-12-05 MED ORDER — ROSUVASTATIN CALCIUM 10 MG PO TABS: 10.0000 mg | ORAL_TABLET | Freq: Every day | ORAL | 4 refills | Status: DC

## 2023-12-05 MED ORDER — DULOXETINE HCL 30 MG PO CPEP: 30.0000 mg | ORAL_CAPSULE | Freq: Every day | ORAL | 3 refills | Status: DC

## 2023-12-05 MED ORDER — LISINOPRIL-HYDROCHLOROTHIAZIDE 20-25 MG PO TABS: 2.0000 | ORAL_TABLET | Freq: Every day | ORAL | 6 refills | Status: DC

## 2023-12-05 MED ORDER — OXYCODONE-ACETAMINOPHEN 10-325 MG PO TABS: 1.0000 | ORAL_TABLET | Freq: Two times a day (BID) | ORAL | 0 refills | Status: DC | PRN

## 2023-12-05 NOTE — Addendum Note (Signed)
 Addended by: Krystiana Fornes on: 12/05/2023 10:43 AM   Modules accepted: Orders

## 2023-12-05 NOTE — Assessment & Plan Note (Signed)
 He will cut down gabapentin  to 1 tablets twice a day for 1 week then 1 tablets at nighttime for another week and then stop.  He will take pregabalin  1 tablets twice a day and continue with oxycodone  1 tablets twice a day.  I have reminded him not to take extra pain medication.

## 2023-12-05 NOTE — Assessment & Plan Note (Signed)
 His blood pressure is still high so he will take 2 lisinopril  hydrochlorothiazide  in the morning and will continue with the metoprolol  25 mg twice a day.  He will come back in 1 week for CMP and lipid panel.

## 2023-12-05 NOTE — Assessment & Plan Note (Signed)
 He has not started duloxetine  but he says that he will started.

## 2023-12-05 NOTE — Progress Notes (Addendum)
 Office Visit  Subjective   Patient ID: Kyle Johns   DOB: 08/24/1965   Age: 58 y.o.   MRN: [id number]   Chief Complaint Chief Complaint  Patient presents with   Hypertension    1 month follow up     History of Present Illness 58 years old male who is here for follow up. He says that he was able to go to watch his son's game after long time because his pain is better.  He says that last week when it was hold his pain flared up so he has to take extra pain medication and today he took the last dose of pain medication. He has a chemotherapy-induced neuropathy and he has chronic pain from that. He was seeing Dr. Oneita neurologist at [facility] and was also getting pain medication from Dr. Ezzard at cancer center.  Currently he is cancer free. I have referred him multiple times to pain clinic but either he was not accepted or he misses appointment so they did not seem.  He is happy and more relaxed today.   I have discussed with him to taper that off gabapentin  off but he still taking gabapentin  600 mg 2 tablets in the morning and 1 in the evening.  Lyrica  was approved but he have not started yet.  I have started him on oxycodone  1 tablets twice a day and also I have added him on duloxetine  30 mg for his anxiety and chronic pain but he has not started that yet.     He has hypertension and his blood pressure is   Better today but still it is high.  Today's blood pressure is 140/100.  He is taking lisinopril  -hydrochlorothiazide  20-12.5 mg 1 tablets in the morning and metoprolol  25 mg twice a day.       He has diabetes mellitus and his hemoglobin A1c was 6.7%.  He do not check his sugar at home.  He has seen Dr. Vannie in June this year.  His cholesterol was high and he does not take any medication for cholesterol. He says that he does not drink.  He has diabetes mellitus and he takes metformin .  His hemoglobin A1c was 6.7.     He says that he is still very upset and is anxious but does not have  any suicidal ideation.  He says that he is frustrated because of his pain.       His cholesterol was high but I do not have a recent cholesterol result.  I will do lipid panel on next visit.    He has history of colon cancer s/o colon resection finished 12 rounds of chemotherapy and he is cancer free and he will follow with cancer center.  Past Medical History Past Medical History:  Diagnosis Date   History of colon surgery 2020   History of lumbar surgery 1992     Allergies No Known Allergies   Review of Systems Review of Systems  Constitutional: Negative.   Respiratory: Negative.    Cardiovascular: Negative.   Musculoskeletal:  Positive for back pain.  Neurological:  Positive for headaches.       Objective:    Vitals BP (!) 140/100 (BP Location: Left Arm, Patient Position: Sitting, Cuff Size: Normal)   Pulse 60   Temp (!) 97.2 F (36.2 C)   Resp 18   Ht 5' 11 (1.803 m)   Wt 213 lb 4 oz (96.7 kg)   SpO2 98%   BMI 29.74  kg/m    Physical Examination Physical Exam Constitutional:      Appearance: Normal appearance.  HENT:     Head: Normocephalic and atraumatic.  Eyes:     Extraocular Movements: Extraocular movements intact.     Pupils: Pupils are equal, round, and reactive to light.  Cardiovascular:     Rate and Rhythm: Normal rate and regular rhythm.     Heart sounds: Normal heart sounds.  Pulmonary:     Effort: Pulmonary effort is normal.     Breath sounds: Normal breath sounds.  Abdominal:     General: Bowel sounds are normal.     Palpations: Abdomen is soft.  Neurological:     General: No focal deficit present.     Mental Status: He is alert and oriented to person, place, and time.        Assessment & Plan:   Benign essential hypertension   His blood pressure is still high so he will take 2 lisinopril  hydrochlorothiazide  in the morning and will continue with the metoprolol  25 mg twice a day.  He will come back in 1 week for CMP and lipid  panel.  Dyslipidemia associated with type 2 diabetes mellitus (HCC)   His cholesterol is well controlled and I have started him on rosuvastatin  10 mg daily.  He will come back in 1 week for lipid panel and CMP.  Chronic bilateral low back pain with bilateral sciatica   He will cut down gabapentin  to 1 tablets twice a day for 1 week then 1 tablets at nighttime for another week and then stop.  He will take pregabalin  1 tablets twice a day and continue with oxycodone  1 tablets twice a day.  I have reminded him not to take extra pain medication.  Anxiety   He has not started duloxetine  but he says that he will started.    Return in about 4 weeks (around 01/02/2024), or 1 week for lipid panel and CMP, for For PE.   Roetta Dare, MD

## 2023-12-05 NOTE — Assessment & Plan Note (Signed)
 His cholesterol is well controlled and I have started him on rosuvastatin  10 mg daily.  He will come back in 1 week for lipid panel and CMP.

## 2024-01-02 ENCOUNTER — Encounter: Payer: Self-pay | Admitting: Internal Medicine

## 2024-01-02 ENCOUNTER — Ambulatory Visit: Admitting: Internal Medicine

## 2024-01-02 VITALS — BP 124/80 | HR 57 | Temp 97.8°F | Resp 18 | Ht 71.0 in | Wt 216.4 lb

## 2024-01-02 DIAGNOSIS — J302 Other seasonal allergic rhinitis: Secondary | ICD-10-CM | POA: Insufficient documentation

## 2024-01-02 DIAGNOSIS — M5442 Lumbago with sciatica, left side: Secondary | ICD-10-CM | POA: Diagnosis not present

## 2024-01-02 DIAGNOSIS — E1169 Type 2 diabetes mellitus with other specified complication: Secondary | ICD-10-CM | POA: Diagnosis not present

## 2024-01-02 DIAGNOSIS — M5441 Lumbago with sciatica, right side: Secondary | ICD-10-CM | POA: Diagnosis not present

## 2024-01-02 DIAGNOSIS — F419 Anxiety disorder, unspecified: Secondary | ICD-10-CM

## 2024-01-02 DIAGNOSIS — I10 Essential (primary) hypertension: Secondary | ICD-10-CM | POA: Diagnosis not present

## 2024-01-02 DIAGNOSIS — E785 Hyperlipidemia, unspecified: Secondary | ICD-10-CM | POA: Diagnosis not present

## 2024-01-02 DIAGNOSIS — G8929 Other chronic pain: Secondary | ICD-10-CM | POA: Diagnosis not present

## 2024-01-02 DIAGNOSIS — K219 Gastro-esophageal reflux disease without esophagitis: Secondary | ICD-10-CM

## 2024-01-02 DIAGNOSIS — G620 Drug-induced polyneuropathy: Secondary | ICD-10-CM | POA: Diagnosis not present

## 2024-01-02 MED ORDER — PREGABALIN 100 MG PO CAPS: 100.0000 mg | ORAL_CAPSULE | Freq: Two times a day (BID) | ORAL | 3 refills | Status: DC

## 2024-01-02 MED ORDER — OXYCODONE-ACETAMINOPHEN 10-325 MG PO TABS: 1.0000 | ORAL_TABLET | Freq: Two times a day (BID) | ORAL | 0 refills | Status: DC | PRN

## 2024-01-02 MED ORDER — FLUTICASONE PROPIONATE 50 MCG/ACT NA SUSP: 1.0000 | Freq: Every day | NASAL | 2 refills | Status: AC

## 2024-01-02 NOTE — Progress Notes (Signed)
 Office Visit  Subjective   Patient ID: Kyle Johns   DOB: 03-29-1966   Age: 58 y.o.   MRN: [id number]   Chief Complaint Chief Complaint  Patient presents with   Hypertension    1 month follow up     History of Present Illness 58 years old male who is here for follow up. He says that change of weather has made his pain worse.   He has stopped taking gabapentin  intake pregabalin  75 mg twice a day and Percocet 1 tablets twice a day as needed.  He says that he still hurt, pain get worse when he bends her lift something. He has a chemotherapy-induced neuropathy and he has chronic pain from that. He was seeing Dr. Oneita neurologist at [facility] and was also getting pain medication from Dr. Ezzard at cancer center.  Currently he is cancer free. I have referred him multiple times to pain clinic but either he was not accepted or he misses appointment so they did not seem.    He also takes duloxetine  30 mg for his anxiety and chronic pain.  He looks and feels better.     He is complaining of stuffy nose and he says that this time of year he always has a problem.    He has hypertension and his blood pressure is   Better today but still it is high.  Today's blood pressure is 140/100.  He is taking lisinopril  -hydrochlorothiazide  20-12.5 mg 1 tablets in the morning and metoprolol  25 mg twice a day.    He has diabetes mellitus and his hemoglobin A1c was 6.7%.  He do not check his sugar at home.  He has seen Dr. Vannie in June this year.  His cholesterol was high and he does not take any medication for cholesterol. He says that he does not drink.  He has diabetes mellitus and he takes metformin .  His hemoglobin A1c was 6.7.   His cholesterol was high and I have started him on rosuvastatin  10 mg daily.  He need to have a lipid panel and CMP done.   He has history of colon cancer s/o colon resection finished 12 rounds of chemotherapy and he is cancer free and he will follow with cancer  center.   Past Medical History Past Medical History:  Diagnosis Date   History of colon surgery 2020   History of lumbar surgery 1992     Allergies No Known Allergies   Review of Systems Review of Systems  Constitutional: Negative.   HENT: Negative.    Respiratory: Negative.    Cardiovascular: Negative.   Gastrointestinal: Negative.   Musculoskeletal:  Positive for back pain.  Neurological: Negative.        Objective:    Vitals BP 124/80   Pulse (!) 57   Temp 97.8 F (36.6 C)   Resp 18   Ht 5' 11 (1.803 m)   Wt 216 lb 6 oz (98.1 kg)   SpO2 97%   BMI 30.18 kg/m    Physical Examination Physical Exam Constitutional:      Appearance: Normal appearance.  HENT:     Head: Normocephalic and atraumatic.  Cardiovascular:     Rate and Rhythm: Normal rate and regular rhythm.     Heart sounds: Normal heart sounds.  Pulmonary:     Effort: Pulmonary effort is normal.     Breath sounds: Normal breath sounds.  Abdominal:     General: Bowel sounds are normal.  Palpations: Abdomen is soft.  Neurological:     General: No focal deficit present.     Mental Status: He is alert and oriented to person, place, and time.        Assessment & Plan:   Benign essential hypertension   His blood pressure is well controlled.  Dyslipidemia associated with type 2 diabetes mellitus (HCC)   His lipid panel need to be done on next visit.  His hemoglobin A1c is well controlled.  Chronic bilateral low back pain with bilateral sciatica   He will continue with oxycodone  and pregabalin .  Neuropathy due to drug   I will increase the dose of pregabalin  200 mg twice a day and monitor.  Anxiety   Better    Return in about 2 months (around 03/03/2024).   Roetta Dare, MD

## 2024-01-02 NOTE — Assessment & Plan Note (Signed)
 I will increase the dose of pregabalin  200 mg twice a day and monitor.

## 2024-01-02 NOTE — Assessment & Plan Note (Signed)
 His lipid panel need to be done on next visit.  His hemoglobin A1c is well controlled.

## 2024-01-02 NOTE — Assessment & Plan Note (Signed)
His blood pressure is well-controlled 

## 2024-01-02 NOTE — Assessment & Plan Note (Signed)
 He will continue with oxycodone  and pregabalin .

## 2024-01-02 NOTE — Assessment & Plan Note (Signed)
 Better

## 2024-01-15 ENCOUNTER — Encounter: Payer: Self-pay | Admitting: *Deleted

## 2024-01-15 NOTE — Progress Notes (Signed)
 Kyle Johns                                          MRN: [id number]   01/15/2024   The [facility] Quality Team Specialist reviewed this patient medical record for the purposes of chart review for care gap closure. The following were reviewed: abstraction for care gap closure-controlling blood pressure.    [facility] Quality Team

## 2024-01-16 ENCOUNTER — Other Ambulatory Visit: Payer: Self-pay | Admitting: Internal Medicine

## 2024-01-16 DIAGNOSIS — I10 Essential (primary) hypertension: Secondary | ICD-10-CM

## 2024-01-16 MED ORDER — METOPROLOL TARTRATE 25 MG PO TABS: 25.0000 mg | ORAL_TABLET | Freq: Two times a day (BID) | ORAL | 2 refills | Status: DC

## 2024-01-28 ENCOUNTER — Other Ambulatory Visit: Payer: Self-pay | Admitting: Internal Medicine

## 2024-01-28 MED ORDER — OXYCODONE-ACETAMINOPHEN 10-325 MG PO TABS: 1.0000 | ORAL_TABLET | Freq: Two times a day (BID) | ORAL | 0 refills | Status: DC | PRN

## 2024-01-29 ENCOUNTER — Other Ambulatory Visit: Payer: Self-pay | Admitting: Internal Medicine

## 2024-02-02 ENCOUNTER — Other Ambulatory Visit: Payer: Self-pay | Admitting: Internal Medicine

## 2024-02-02 MED ORDER — ESOMEPRAZOLE MAGNESIUM 40 MG PO CPDR: 40.0000 mg | DELAYED_RELEASE_CAPSULE | Freq: Every day | ORAL | 2 refills | Status: DC

## 2024-03-05 ENCOUNTER — Ambulatory Visit: Admitting: Internal Medicine

## 2024-03-12 ENCOUNTER — Ambulatory Visit: Admitting: Internal Medicine

## 2024-03-15 ENCOUNTER — Ambulatory Visit

## 2024-03-15 VITALS — BP 122/72 | HR 66 | Temp 97.7°F | Resp 16 | Ht 71.0 in | Wt 203.2 lb

## 2024-03-15 DIAGNOSIS — E785 Hyperlipidemia, unspecified: Secondary | ICD-10-CM | POA: Diagnosis not present

## 2024-03-15 DIAGNOSIS — K219 Gastro-esophageal reflux disease without esophagitis: Secondary | ICD-10-CM

## 2024-03-15 DIAGNOSIS — M5442 Lumbago with sciatica, left side: Secondary | ICD-10-CM | POA: Diagnosis not present

## 2024-03-15 DIAGNOSIS — G8929 Other chronic pain: Secondary | ICD-10-CM

## 2024-03-15 DIAGNOSIS — E1169 Type 2 diabetes mellitus with other specified complication: Secondary | ICD-10-CM

## 2024-03-15 DIAGNOSIS — I10 Essential (primary) hypertension: Secondary | ICD-10-CM | POA: Diagnosis not present

## 2024-03-15 DIAGNOSIS — G894 Chronic pain syndrome: Secondary | ICD-10-CM | POA: Diagnosis not present

## 2024-03-15 DIAGNOSIS — N529 Male erectile dysfunction, unspecified: Secondary | ICD-10-CM

## 2024-03-15 DIAGNOSIS — M5441 Lumbago with sciatica, right side: Secondary | ICD-10-CM | POA: Diagnosis not present

## 2024-03-15 MED ORDER — METFORMIN HCL ER 500 MG PO TB24: 500.0000 mg | ORAL_TABLET | Freq: Every day | ORAL | 6 refills | Status: DC

## 2024-03-15 MED ORDER — PREGABALIN 100 MG PO CAPS: 100.0000 mg | ORAL_CAPSULE | Freq: Two times a day (BID) | ORAL | 3 refills | Status: DC

## 2024-03-15 MED ORDER — DULOXETINE HCL 30 MG PO CPEP: 30.0000 mg | ORAL_CAPSULE | Freq: Every day | ORAL | 3 refills | Status: DC

## 2024-03-15 MED ORDER — METOPROLOL TARTRATE 25 MG PO TABS: 25.0000 mg | ORAL_TABLET | Freq: Two times a day (BID) | ORAL | 0 refills | Status: DC

## 2024-03-15 MED ORDER — ESOMEPRAZOLE MAGNESIUM 40 MG PO CPDR: 40.0000 mg | DELAYED_RELEASE_CAPSULE | Freq: Every day | ORAL | 2 refills | Status: DC

## 2024-03-15 MED ORDER — LISINOPRIL-HYDROCHLOROTHIAZIDE 20-25 MG PO TABS: 2.0000 | ORAL_TABLET | Freq: Every day | ORAL | 6 refills | Status: DC

## 2024-03-15 MED ORDER — SILDENAFIL CITRATE 50 MG PO TABS: 50.0000 mg | ORAL_TABLET | Freq: Every day | ORAL | 1 refills | Status: AC | PRN

## 2024-03-15 MED ORDER — OXYCODONE-ACETAMINOPHEN 10-325 MG PO TABS: 1.0000 | ORAL_TABLET | Freq: Two times a day (BID) | ORAL | 0 refills | Status: DC | PRN

## 2024-03-15 MED ORDER — ROSUVASTATIN CALCIUM 10 MG PO TABS: 10.0000 mg | ORAL_TABLET | Freq: Every day | ORAL | 4 refills | Status: DC
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
## 2024-04-09 ENCOUNTER — Ambulatory Visit

## 2024-04-09 DIAGNOSIS — Z0000 Encounter for general adult medical examination without abnormal findings: Secondary | ICD-10-CM

## 2024-04-16 ENCOUNTER — Ambulatory Visit

## 2024-04-16 VITALS — BP 130/80 | HR 94 | Temp 98.1°F | Resp 18 | Ht 71.0 in | Wt 206.0 lb

## 2024-04-16 DIAGNOSIS — K219 Gastro-esophageal reflux disease without esophagitis: Secondary | ICD-10-CM

## 2024-04-16 DIAGNOSIS — G8929 Other chronic pain: Secondary | ICD-10-CM

## 2024-04-16 DIAGNOSIS — E1169 Type 2 diabetes mellitus with other specified complication: Secondary | ICD-10-CM

## 2024-04-16 DIAGNOSIS — G894 Chronic pain syndrome: Secondary | ICD-10-CM

## 2024-04-16 DIAGNOSIS — F419 Anxiety disorder, unspecified: Secondary | ICD-10-CM

## 2024-04-16 DIAGNOSIS — I10 Essential (primary) hypertension: Secondary | ICD-10-CM

## 2024-04-16 MED ORDER — PREGABALIN 100 MG PO CAPS: 100.0000 mg | ORAL_CAPSULE | Freq: Two times a day (BID) | ORAL | 3 refills | Status: AC

## 2024-04-16 MED ORDER — BUSPIRONE HCL 5 MG PO TABS: 5.0000 mg | ORAL_TABLET | Freq: Three times a day (TID) | ORAL | 0 refills | Status: AC

## 2024-04-16 MED ORDER — LISINOPRIL-HYDROCHLOROTHIAZIDE 20-25 MG PO TABS: 2.0000 | ORAL_TABLET | Freq: Every day | ORAL | 6 refills | Status: AC

## 2024-04-16 MED ORDER — ROSUVASTATIN CALCIUM 10 MG PO TABS: 10.0000 mg | ORAL_TABLET | Freq: Every day | ORAL | 4 refills | Status: AC

## 2024-04-16 MED ORDER — METHYLPREDNISOLONE 4 MG PO TBPK: ORAL_TABLET | Freq: Four times a day (QID) | ORAL | 0 refills | Status: AC

## 2024-04-16 MED ORDER — METOPROLOL TARTRATE 25 MG PO TABS: 25.0000 mg | ORAL_TABLET | Freq: Two times a day (BID) | ORAL | 0 refills | Status: AC

## 2024-04-16 MED ORDER — DULOXETINE HCL 30 MG PO CPEP: 30.0000 mg | ORAL_CAPSULE | Freq: Two times a day (BID) | ORAL | 3 refills | Status: AC

## 2024-04-16 MED ORDER — METFORMIN HCL ER 500 MG PO TB24: 500.0000 mg | ORAL_TABLET | Freq: Every day | ORAL | 6 refills | Status: AC

## 2024-04-16 MED ORDER — OXYCODONE-ACETAMINOPHEN 10-325 MG PO TABS: 1.0000 | ORAL_TABLET | Freq: Two times a day (BID) | ORAL | 0 refills | Status: AC | PRN

## 2024-04-16 MED ORDER — ESOMEPRAZOLE MAGNESIUM 40 MG PO CPDR: 40.0000 mg | DELAYED_RELEASE_CAPSULE | Freq: Every day | ORAL | 2 refills | Status: AC

## 2024-04-16 NOTE — Assessment & Plan Note (Signed)
 Unable to increase pain medication other than Duloxetine 

## 2024-04-16 NOTE — Assessment & Plan Note (Signed)
Take Lisinopril daily as prescribed

## 2024-04-16 NOTE — Assessment & Plan Note (Addendum)
 Start Buspar  PO TID for anxiety.
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
## 2024-04-16 NOTE — Assessment & Plan Note (Signed)
 Start Medrol  pack for Sciatica. Will refer to physical therapy

## 2024-04-23 NOTE — Progress Notes (Signed)
 Kyle Johns                                          MRN: [id number]   04/23/2024   The [facility] Quality Team Specialist reviewed this patient medical record for the purposes of chart review for care gap closure. The following were reviewed: abstraction for care gap closure-controlling blood pressure.    [facility]BCI Quality Team

## 2024-05-14 ENCOUNTER — Ambulatory Visit
